# Patient Record
Sex: Male | Born: 1955 | Race: White | Hispanic: No | Marital: Married | Smoking: Never smoker
Health system: Southern US, Community
[De-identification: ages and names within clinical notes are randomized; demographics above are authoritative.]

## PROBLEM LIST (undated history)

## (undated) DIAGNOSIS — I4892 Unspecified atrial flutter: Secondary | ICD-10-CM

## (undated) DIAGNOSIS — K219 Gastro-esophageal reflux disease without esophagitis: Secondary | ICD-10-CM

## (undated) DIAGNOSIS — I10 Essential (primary) hypertension: Secondary | ICD-10-CM

## (undated) DIAGNOSIS — J309 Allergic rhinitis, unspecified: Secondary | ICD-10-CM

## (undated) HISTORY — DX: Gastro-esophageal reflux disease without esophagitis: K21.9

## (undated) HISTORY — DX: Essential (primary) hypertension: I10

## (undated) HISTORY — DX: Allergic rhinitis, unspecified: J30.9

---

## 2006-10-24 ENCOUNTER — Other Ambulatory Visit: Payer: Self-pay

## 2006-10-24 ENCOUNTER — Ambulatory Visit: Payer: Self-pay | Admitting: General Surgery

## 2006-10-29 ENCOUNTER — Ambulatory Visit: Payer: Self-pay | Admitting: General Surgery

## 2014-05-06 DIAGNOSIS — I10 Essential (primary) hypertension: Secondary | ICD-10-CM | POA: Insufficient documentation

## 2014-05-06 DIAGNOSIS — K219 Gastro-esophageal reflux disease without esophagitis: Secondary | ICD-10-CM | POA: Insufficient documentation

## 2014-05-06 DIAGNOSIS — J301 Allergic rhinitis due to pollen: Secondary | ICD-10-CM | POA: Insufficient documentation

## 2014-05-06 DIAGNOSIS — E781 Pure hyperglyceridemia: Secondary | ICD-10-CM | POA: Insufficient documentation

## 2017-04-25 DIAGNOSIS — Z23 Encounter for immunization: Secondary | ICD-10-CM | POA: Diagnosis not present

## 2018-05-19 DIAGNOSIS — J019 Acute sinusitis, unspecified: Secondary | ICD-10-CM | POA: Diagnosis not present

## 2018-05-19 DIAGNOSIS — H66001 Acute suppurative otitis media without spontaneous rupture of ear drum, right ear: Secondary | ICD-10-CM | POA: Diagnosis not present

## 2018-05-19 DIAGNOSIS — B9689 Other specified bacterial agents as the cause of diseases classified elsewhere: Secondary | ICD-10-CM | POA: Diagnosis not present

## 2018-06-06 DIAGNOSIS — H40039 Anatomical narrow angle, unspecified eye: Secondary | ICD-10-CM | POA: Diagnosis not present

## 2018-12-19 ENCOUNTER — Ambulatory Visit: Payer: Self-pay | Admitting: Internal Medicine

## 2018-12-25 DIAGNOSIS — E781 Pure hyperglyceridemia: Secondary | ICD-10-CM | POA: Diagnosis not present

## 2018-12-25 DIAGNOSIS — Z23 Encounter for immunization: Secondary | ICD-10-CM | POA: Diagnosis not present

## 2018-12-25 DIAGNOSIS — K219 Gastro-esophageal reflux disease without esophagitis: Secondary | ICD-10-CM | POA: Diagnosis not present

## 2018-12-25 DIAGNOSIS — I10 Essential (primary) hypertension: Secondary | ICD-10-CM | POA: Diagnosis not present

## 2018-12-25 DIAGNOSIS — Z0000 Encounter for general adult medical examination without abnormal findings: Secondary | ICD-10-CM | POA: Diagnosis not present

## 2019-01-12 DIAGNOSIS — Z0000 Encounter for general adult medical examination without abnormal findings: Secondary | ICD-10-CM | POA: Diagnosis not present

## 2019-01-12 DIAGNOSIS — Z125 Encounter for screening for malignant neoplasm of prostate: Secondary | ICD-10-CM | POA: Diagnosis not present

## 2019-02-10 ENCOUNTER — Ambulatory Visit: Payer: Self-pay | Admitting: Urology

## 2019-02-17 ENCOUNTER — Ambulatory Visit: Payer: No Typology Code available for payment source | Admitting: Urology

## 2019-02-17 ENCOUNTER — Encounter: Payer: Self-pay | Admitting: Urology

## 2019-02-17 ENCOUNTER — Other Ambulatory Visit: Payer: Self-pay

## 2019-02-17 VITALS — BP 193/112 | HR 85 | Ht 68.5 in | Wt 192.6 lb

## 2019-02-17 DIAGNOSIS — N486 Induration penis plastica: Secondary | ICD-10-CM

## 2019-02-17 NOTE — Progress Notes (Signed)
02/17/2019 9:07 AM   L[place]ayette Dragon May 11, 1956 [id number]  Referring provider: Sofie Hartigan, MD [address],  [place] [address]  Chief Complaint  Patient presents with  . Other    HPI: 63 year old male seen in consultation at the request of Dr. 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  Approximately 1 year ago he noted leftward curvature with erections at midshaft.  He estimates the curvature at 45 degrees and no significant change in the past 6 months.  He denies penile pain with erections.  He does note a slight hourglass deformity.  He has no significant erectile dysfunction and the curvature does not prohibit intercourse.  No history of penile fracture or Dupuytren's contracture.    Past urologic history remarkable for hydrocelectomy.  He denies bothersome lower urinary tract symptoms.  A PSA performed June 2020 was 1.88.   PMH: Past Medical History:  Diagnosis Date  . Allergic rhinitis   . GERD (gastroesophageal reflux disease)   . HTN (hypertension)     Surgical History: . DEVIATED SEPTUM SURGERY  . EXCISION HYDROCELE UNILATERAL Right  . HERNIA REPAIR 1/91/4782  umbilical hernia repair  . TONSILLECTOMY AND ADENOIDECTOMY   Home Medications:  Allergies as of 02/17/2019      Reactions   Codeine    Other reaction(s): Unknown      Medication List       Accurate as of February 17, 2019  9:07 AM. If you have any questions, ask your nurse or doctor.        famotidine 20 MG/2ML Soln   Ibuprofen 200 MG Caps   Olopatadine HCl 0.6 % Soln Place into the nose.   tiZANidine 4 MG tablet Commonly known as: ZANAFLEX Take 4 mg by mouth every 8 (eight) hours as needed.   triamcinolone 55 MCG/ACT Aero nasal inhaler Commonly known as: NASACORT Place into the nose.   Tylenol 325 MG Caps Generic drug: Acetaminophen   vitamin B-12 1000 MCG tablet Commonly known as: CYANOCOBALAMIN   Vitamin D 50 MCG (2000 UT) Caps       Allergies:  Allergies   Allergen Reactions  . Codeine     Other reaction(s): Unknown    Family History: No family history on file.  Social History:  reports that he has never smoked. He has never used smokeless tobacco. He reports current alcohol use. He reports that he does not use drugs.  ROS: UROLOGY Frequent Urination?: No Hard to postpone urination?: No Burning/pain with urination?: No Get up at night to urinate?: No Leakage of urine?: No Urine stream starts and stops?: No Trouble starting stream?: No Do you have to strain to urinate?: No Blood in urine?: No Urinary tract infection?: No Sexually transmitted disease?: No Injury to kidneys or bladder?: No Painful intercourse?: No Weak stream?: No Erection problems?: Yes Penile pain?: No  Gastrointestinal Nausea?: No Vomiting?: No Indigestion/heartburn?: No Diarrhea?: No Constipation?: No  Constitutional Fever: No Night sweats?: No Weight loss?: No Fatigue?: No  Skin Skin rash/lesions?: No Itching?: No  Eyes Blurred vision?: No Double vision?: No  Ears/Nose/Throat Sore throat?: No Sinus problems?: Yes  Hematologic/Lymphatic Swollen glands?: No Easy bruising?: No  Cardiovascular Leg swelling?: No Chest pain?: No  Respiratory Cough?: No Shortness of breath?: No  Endocrine Excessive thirst?: No  Musculoskeletal Back pain?: No Joint pain?: No  Neurological Headaches?: No Dizziness?: No  Psychologic Depression?: No Anxiety?: No  Physical Exam: BP (!) 193/112 (BP Location: Left Arm, Patient Position: Sitting, Cuff  Size: Normal)   Pulse 85   Ht 5' 8.5" (1.74 m)   Wt 192 lb 9.6 oz (87.4 kg)   BMI 28.86 kg/m   Constitutional:  Alert and oriented, No acute distress. HEENT: [place] AT, moist mucus membranes.  Trachea midline, no masses. Cardiovascular: No clubbing, cyanosis, or edema. Respiratory: Normal respiratory effort, no increased work of breathing. GI: Abdomen is soft, nontender, nondistended, no  abdominal masses GU: No CVA tenderness.  Penis circumcised.  Left lateral corpus distal shaft plaque palpated approximately 1 cm Skin: No rashes, bruises or suspicious lesions. Neurologic: Grossly intact, no focal deficits, moving all 4 extremities. Psychiatric: Normal mood and affect.   Assessment & Plan:   Based on patient's history of physical exam, findings are consistent with Peyronie's disease.  Pathophysiology was discussed at length including the 2 phases of the diease, acute and chronic.  Treatment options and goals of treatment were discussed today in detail. Options including observation, penile plaque and graft, penile plication, placement of penile prosthesis, and injection of collagenase were all reviewed.  An benefits of each were discussed at length.  We also discussed off label use of medications such as pentoxifylline and the use penile traction devices.  He would like to think over these options and will call back with his decision or any further questions.  If he elects observation will set up a 3866-month follow-up.   Riki AltesScott C Laurann Mcmorris, MD  [facility] [address], [address] [place], [place] [address] [phone]

## 2019-02-23 ENCOUNTER — Encounter: Payer: Self-pay | Admitting: Urology

## 2019-02-26 ENCOUNTER — Other Ambulatory Visit: Payer: Self-pay | Admitting: Urology

## 2019-02-26 MED ORDER — TADALAFIL 20 MG PO TABS: ORAL_TABLET | ORAL | 0 refills | Status: DC

## 2019-08-17 ENCOUNTER — Ambulatory Visit: Payer: No Typology Code available for payment source | Admitting: Urology

## 2019-09-02 ENCOUNTER — Ambulatory Visit (INDEPENDENT_AMBULATORY_CARE_PROVIDER_SITE_OTHER): Payer: No Typology Code available for payment source | Admitting: Urology

## 2019-09-02 ENCOUNTER — Other Ambulatory Visit: Payer: Self-pay

## 2019-09-02 ENCOUNTER — Encounter: Payer: Self-pay | Admitting: Urology

## 2019-09-02 VITALS — BP 194/84 | HR 62 | Ht 68.0 in | Wt 180.0 lb

## 2019-09-02 DIAGNOSIS — N486 Induration penis plastica: Secondary | ICD-10-CM | POA: Diagnosis not present

## 2019-09-02 DIAGNOSIS — N521 Erectile dysfunction due to diseases classified elsewhere: Secondary | ICD-10-CM

## 2019-09-02 MED ORDER — TADALAFIL 20 MG PO TABS: ORAL_TABLET | ORAL | 6 refills | Status: DC

## 2019-09-02 NOTE — Progress Notes (Signed)
   09/02/2019 9:51 AM   Gordon Garcia 01/19/1956 [id number]  Referring provider: Marina Goodell, MD [address] [place],  [place] [address]  Chief Complaint  Patient presents with  . Follow-up    HPI: 64 y.o. male presents for a follow-up of Peyronie's disease.  He was seen August 2020 with complaints of a 45 degree curvature.  He had mild to moderate ED and was started on tadalafil.  Management options for Peyronie's.  He states the tadalafil has been effective.  His curvature has been stable.  No pain with erections.   PMH: Past Medical History:  Diagnosis Date  . Allergic rhinitis   . GERD (gastroesophageal reflux disease)   . HTN (hypertension)     Surgical History: No past surgical history on file.  Home Medications:  Allergies as of 09/02/2019      Reactions   Codeine    Other reaction(s): Unknown      Medication List       Accurate as of September 02, 2019  9:51 AM. If you have any questions, ask your nurse or doctor.        famotidine 20 MG/2ML Soln   Ibuprofen 200 MG Caps   Olopatadine HCl 0.6 % Soln Place into the nose.   tadalafil 20 MG tablet Commonly known as: CIALIS 1 tablet by mouth 1 hour prior to intercourse   tiZANidine 4 MG tablet Commonly known as: ZANAFLEX Take 4 mg by mouth every 8 (eight) hours as needed.   triamcinolone 55 MCG/ACT Aero nasal inhaler Commonly known as: NASACORT Place into the nose.   Tylenol 325 MG Caps Generic drug: Acetaminophen   vitamin B-12 1000 MCG tablet Commonly known as: CYANOCOBALAMIN   Vitamin D 50 MCG (2000 UT) Caps       Allergies:  Allergies  Allergen Reactions  . Codeine     Other reaction(s): Unknown    Family History: No family history on file.  Social History:  reports that he has never smoked. He has never used smokeless tobacco. He reports current alcohol use. He reports that he does not use drugs.   Physical Exam: BP (!) 194/84   Pulse 62   Ht 5\' 8"  (1.727 m)   Wt 180  lb (81.6 kg)   BMI 27.37 kg/m   Constitutional:  Alert and oriented, No acute distress. HEENT: [place] AT, moist mucus membranes.  Trachea midline, no masses. Cardiovascular: No clubbing, cyanosis, or edema. Respiratory: Normal respiratory effort, no increased work of breathing. Neurologic: Grossly intact, no focal deficits, moving all 4 extremities. Psychiatric: Normal mood and affect.   Assessment & Plan:    - Peyronie's disease Stable curvature and able to have successful intercourse with tadalafil.  He does not desire any treatment.  - Erectile dysfunction Tadalafil was refilled.  Can see him back as needed if Dr. will refill tadalafil annually.   Maryjane Hurter, MD  [facility] [address] [place], [place] [id number]

## 2019-09-04 ENCOUNTER — Encounter: Payer: Self-pay | Admitting: Urology

## 2019-09-04 DIAGNOSIS — N521 Erectile dysfunction due to diseases classified elsewhere: Secondary | ICD-10-CM | POA: Insufficient documentation

## 2020-09-19 ENCOUNTER — Encounter: Payer: Self-pay | Admitting: *Deleted

## 2021-01-09 ENCOUNTER — Other Ambulatory Visit: Payer: Self-pay

## 2021-01-09 DIAGNOSIS — Z86010 Personal history of colonic polyps: Secondary | ICD-10-CM

## 2021-01-09 MED ORDER — NA SULFATE-K SULFATE-MG SULF 17.5-3.13-1.6 GM/177ML PO SOLN: 354.0000 mL | Freq: Once | ORAL | 0 refills | Status: AC

## 2021-01-09 NOTE — Progress Notes (Unsigned)
Gastroenterology Pre-Procedure Review  Request Date: 02/14/2021 Requesting Physician: Dr. Servando Snare  PATIENT REVIEW QUESTIONS: The patient responded to the following health history questions as indicated:    1. Are you having any GI issues? no 2. Do you have a personal history of Polyps? Yes  3. Do you have a family history of Colon Cancer or Polyps?  No  4. Diabetes Mellitus? no 5. Joint replacements in the past 12 months?no 6. Major health problems in the past 3 months?no 7. Any artificial heart valves, MVP, or defibrillator?no    MEDICATIONS & ALLERGIES:    Patient reports the following regarding taking any anticoagulation/antiplatelet therapy:   Plavix, Coumadin, Eliquis, Xarelto, Lovenox, Pradaxa, Brilinta, or Effient? no Aspirin? no  Patient confirms/reports the following medications:  Current Outpatient Medications  Medication Sig Dispense Refill   Na Sulfate-K Sulfate-Mg Sulf 17.5-3.13-1.6 GM/177ML SOLN Take 354 mLs by mouth once for 1 dose. 354 mL 0   Acetaminophen (TYLENOL) 325 MG CAPS      Cholecalciferol (VITAMIN D) 50 MCG (2000 UT) CAPS      famotidine 20 MG/2ML SOLN      Ibuprofen 200 MG CAPS      Olopatadine HCl 0.6 % SOLN Place into the nose.     tadalafil (CIALIS) 20 MG tablet 1 tablet by mouth 1 hour prior to intercourse 30 tablet 6   tiZANidine (ZANAFLEX) 4 MG tablet Take 4 mg by mouth every 8 (eight) hours as needed.     triamcinolone (NASACORT) 55 MCG/ACT AERO nasal inhaler Place into the nose.     vitamin B-12 (CYANOCOBALAMIN) 1000 MCG tablet      No current facility-administered medications for this visit.    Patient confirms/reports the following allergies:  Allergies  Allergen Reactions   Codeine     Other reaction(s): Unknown    No orders of the defined types were placed in this encounter.   AUTHORIZATION INFORMATION Primary Insurance: 1D#: Group #:  Secondary Insurance: 1D#: Group #:  SCHEDULE INFORMATION: Date:  Time: Location:

## 2021-02-14 ENCOUNTER — Ambulatory Visit
Admission: RE | Admit: 2021-02-14 | Discharge: 2021-02-14 | Disposition: A | Discharge status: Home / Self Care | Payer: 59 | Attending: Gastroenterology | Admitting: Gastroenterology

## 2021-02-14 ENCOUNTER — Encounter: Payer: Self-pay | Admitting: Gastroenterology

## 2021-02-14 ENCOUNTER — Ambulatory Visit: Payer: 59 | Admitting: Certified Registered"

## 2021-02-14 ENCOUNTER — Other Ambulatory Visit: Payer: Self-pay

## 2021-02-14 ENCOUNTER — Encounter
Admission: RE | Disposition: A | Discharge status: Home / Self Care | Payer: Self-pay | Source: Home / Self Care | Attending: Gastroenterology

## 2021-02-14 DIAGNOSIS — Z79899 Other long term (current) drug therapy: Secondary | ICD-10-CM | POA: Insufficient documentation

## 2021-02-14 DIAGNOSIS — Z791 Long term (current) use of non-steroidal anti-inflammatories (NSAID): Secondary | ICD-10-CM | POA: Insufficient documentation

## 2021-02-14 DIAGNOSIS — Z885 Allergy status to narcotic agent status: Secondary | ICD-10-CM | POA: Diagnosis not present

## 2021-02-14 DIAGNOSIS — K641 Second degree hemorrhoids: Secondary | ICD-10-CM | POA: Diagnosis not present

## 2021-02-14 DIAGNOSIS — Z860100 Personal history of colon polyps, unspecified: Secondary | ICD-10-CM

## 2021-02-14 DIAGNOSIS — K5730 Diverticulosis of large intestine without perforation or abscess without bleeding: Secondary | ICD-10-CM | POA: Insufficient documentation

## 2021-02-14 DIAGNOSIS — Z1211 Encounter for screening for malignant neoplasm of colon: Secondary | ICD-10-CM

## 2021-02-14 HISTORY — PX: COLONOSCOPY WITH PROPOFOL: SHX5780

## 2021-02-14 SURGERY — COLONOSCOPY WITH PROPOFOL
Anesthesia: General

## 2021-02-14 MED ORDER — EPHEDRINE 5 MG/ML INJ
INTRAVENOUS | Status: AC
  Filled 2021-02-14: qty 5

## 2021-02-14 MED ORDER — PROPOFOL 10 MG/ML IV BOLUS
INTRAVENOUS | Status: DC | PRN
  Administered 2021-02-14: 70 mg via INTRAVENOUS
  Administered 2021-02-14: 30 mg via INTRAVENOUS

## 2021-02-14 MED ORDER — LIDOCAINE 2% (20 MG/ML) 5 ML SYRINGE
INTRAMUSCULAR | Status: DC | PRN
  Administered 2021-02-14: 25 mg via INTRAVENOUS

## 2021-02-14 MED ORDER — GLYCOPYRROLATE 0.2 MG/ML IJ SOLN
INTRAMUSCULAR | Status: AC
  Filled 2021-02-14: qty 1

## 2021-02-14 MED ORDER — PROPOFOL 500 MG/50ML IV EMUL
INTRAVENOUS | Status: DC | PRN
  Administered 2021-02-14: 120 ug/kg/min via INTRAVENOUS

## 2021-02-14 MED ORDER — SODIUM CHLORIDE 0.9 % IV SOLN: INTRAVENOUS | Status: DC

## 2021-02-14 MED ORDER — LIDOCAINE HCL (PF) 2 % IJ SOLN
INTRAMUSCULAR | Status: AC
  Filled 2021-02-14: qty 5

## 2021-02-14 MED ORDER — MIDAZOLAM HCL 2 MG/2ML IJ SOLN
INTRAMUSCULAR | Status: AC
  Filled 2021-02-14: qty 2

## 2021-02-14 MED ORDER — MIDAZOLAM HCL 5 MG/5ML IJ SOLN
INTRAMUSCULAR | Status: DC | PRN
  Administered 2021-02-14: 2 mg via INTRAVENOUS

## 2021-02-14 NOTE — Op Note (Signed)
[facility] Gastroenterology Patient Name: Gordon Garcia Procedure Date: 02/14/2021 9:59 AM MRN: [id number] Account #: [id number] Date of Birth: 05/23/1956 Admit Type: Outpatient Age: 65 Room: [facility] ENDO ROOM 4 Gender: Male Note Status: Finalized Procedure:             Colonoscopy Indications:           High risk colon cancer surveillance: Personal history                         of colonic polyps Providers:             Midge Minium MD, MD Referring MD:          Marina Goodell (Referring MD) Medicines:             Propofol per Anesthesia Complications:         No immediate complications. Procedure:             Pre-Anesthesia Assessment:                        - Prior to the procedure, a History and Physical was                         performed, and patient medications and allergies were                         reviewed. The patient's tolerance of previous                         anesthesia was also reviewed. The risks and benefits                         of the procedure and the sedation options and risks                         were discussed with the patient. All questions were                         answered, and informed consent was obtained. Prior                         Anticoagulants: The patient has taken no previous                         anticoagulant or antiplatelet agents. ASA Grade                         Assessment: II - A patient with mild systemic disease.                         After reviewing the risks and benefits, the patient                         was deemed in satisfactory condition to undergo the                         procedure.  After obtaining informed consent, the colonoscope was                         passed under direct vision. Throughout the procedure,                         the patient's blood pressure, pulse, and oxygen                         saturations were monitored continuously. The                          Colonoscope was introduced through the anus and                         advanced to the the cecum, identified by appendiceal                         orifice and ileocecal valve. The colonoscopy was                         performed without difficulty. The patient tolerated                         the procedure well. The quality of the bowel                         preparation was excellent. Findings:      The perianal and digital rectal examinations were normal.      A few small-mouthed diverticula were found in the sigmoid colon.      Non-bleeding internal hemorrhoids were found during retroflexion. The       hemorrhoids were Grade II (internal hemorrhoids that prolapse but reduce       spontaneously). Impression:            - Diverticulosis in the sigmoid colon.                        - Non-bleeding internal hemorrhoids.                        - No specimens collected. Recommendation:        - Discharge patient to home.                        - Resume previous diet.                        - Continue present medications.                        - Repeat colonoscopy in 7 years for surveillance. Procedure Code(s):     --- Professional ---                        [phone], Colonoscopy, flexible; diagnostic, including                         collection of specimen(s) by brushing or washing, when  performed (separate procedure) Diagnosis Code(s):     --- Professional ---                        Z86.010, Personal history of colonic polyps CPT copyright 2019 American Medical Association. All rights reserved. The codes documented in this report are preliminary and upon coder review may  be revised to meet current compliance requirements. Midge Minium MD, MD 02/14/2021 10:26:49 AM This report has been signed electronically. Number of Addenda: 0 Note Initiated On: 02/14/2021 9:59 AM Scope Withdrawal Time: 0 hours 6 minutes 54 seconds  Total Procedure Duration: 0 hours 10  minutes 36 seconds  Estimated Blood Loss:  Estimated blood loss: none.      [facility]

## 2021-02-14 NOTE — H&P (Signed)
Gordon Minium, MD [facility] [address], [address] [place], [place] [address] Phone:[phone] Fax : [phone]  Primary Care Physician:  Marina Goodell, MD Primary Gastroenterologist:  Dr. Servando Snare  Pre-Procedure History & Physical: HPI:  Gordon Garcia is a 65 y.o. male is here for an colonoscopy.   Past Medical History:  Diagnosis Date   Allergic rhinitis    GERD (gastroesophageal reflux disease)    HTN (hypertension)     History reviewed. No pertinent surgical history.  Prior to Admission medications   Medication Sig Start Date End Date Taking? Authorizing Provider  Acetaminophen (TYLENOL) 325 MG CAPS    Yes [provider]  Cholecalciferol (VITAMIN D) 50 MCG (2000 UT) CAPS    Yes [provider]  famotidine 20 MG/2ML SOLN    Yes [provider]  Ibuprofen 200 MG CAPS    Yes [provider]  tadalafil (CIALIS) 20 MG tablet 1 tablet by mouth 1 hour prior to intercourse 09/02/19  Yes Stoioff, Verna Czech, MD  tiZANidine (ZANAFLEX) 4 MG tablet Take 4 mg by mouth every 8 (eight) hours as needed. 12/25/18  Yes [provider]  triamcinolone (NASACORT) 55 MCG/ACT AERO nasal inhaler Place into the nose.   Yes [provider]  vitamin B-12 (CYANOCOBALAMIN) 1000 MCG tablet    Yes [provider]  Olopatadine HCl 0.6 % SOLN Place into the nose. 04/22/18 04/22/19  [provider]    Allergies as of 01/10/2021 - Review Complete 09/04/2019  Allergen Reaction Noted   Codeine  05/05/2014    History reviewed. No pertinent family history.  Social History   Socioeconomic History   Marital status: Married    Spouse name: Not on file   Number of children: Not on file   Years of education: Not on file   Highest education level: Not on file  Occupational History   Not on file  Tobacco Use   Smoking status: Never   Smokeless tobacco: Never  Vaping Use   Vaping Use: Some days  Substance and Sexual Activity   Alcohol use:  Yes    Comment: 2-3 drinks per week   Drug use: Never   Sexual activity: Yes    Birth control/protection: None  Other Topics Concern   Not on file  Social History Narrative   Not on file   Social Determinants of Health   Financial Resource Strain: Not on file  Food Insecurity: Not on file  Transportation Needs: Not on file  Physical Activity: Not on file  Stress: Not on file  Social Connections: Not on file  Intimate Partner Violence: Not on file    Review of Systems: See HPI, otherwise negative ROS  Physical Exam: BP (!) 156/94   Pulse (!) 59   Temp 97.7 F (36.5 C) (Temporal)   Resp 16   Ht 5\' 9"  (1.753 m)   Wt 76.2 kg   SpO2 100%   BMI 24.81 kg/m  General:   Alert,  pleasant and cooperative in NAD Head:  Normocephalic and atraumatic. Neck:  Supple; no masses or thyromegaly. Lungs:  Clear throughout to auscultation.    Heart:  Regular rate and rhythm. Abdomen:  Soft, nontender and nondistended. Normal bowel sounds, without guarding, and without rebound.   Neurologic:  Alert and  oriented x4;  grossly normal neurologically.  Impression/Plan: Gordon Garcia is here for an colonoscopy to be performed for a history of adenomatous polyps on 2016  Risks, benefits, limitations, and alternatives  regarding  colonoscopy have been reviewed with the patient.  Questions have been answered.  All parties agreeable.   Gordon Minium, MD  02/14/2021, 10:08 AM

## 2021-02-14 NOTE — Anesthesia Preprocedure Evaluation (Signed)
Anesthesia Evaluation  Patient identified by MRN, date of birth, ID band Patient awake    Reviewed: Allergy & Precautions, H&P , NPO status , Patient's Chart, lab work & pertinent test results, reviewed documented beta blocker date and time   History of Anesthesia Complications Negative for: history of anesthetic complications  Airway Mallampati: I  TM Distance: >3 FB Neck ROM: full    Dental  (+) Dental Advidsory Given, Caps, Implants, Teeth Intact, Missing   Pulmonary neg pulmonary ROS,    Pulmonary exam normal breath sounds clear to auscultation       Cardiovascular Exercise Tolerance: Good hypertension, (-) angina(-) Past MI and (-) Cardiac Stents Normal cardiovascular exam(-) dysrhythmias (-) Valvular Problems/Murmurs Rhythm:regular Rate:Normal     Neuro/Psych negative neurological ROS  negative psych ROS   GI/Hepatic Neg liver ROS, GERD  ,  Endo/Other  negative endocrine ROS  Renal/GU negative Renal ROS  negative genitourinary   Musculoskeletal   Abdominal   Peds  Hematology negative hematology ROS (+)   Anesthesia Other Findings Past Medical History: No date: Allergic rhinitis No date: GERD (gastroesophageal reflux disease) No date: HTN (hypertension)   Reproductive/Obstetrics negative OB ROS                             Anesthesia Physical Anesthesia Plan  ASA: 2  Anesthesia Plan: General   Post-op Pain Management:    Induction: Intravenous  PONV Risk Score and Plan: 2 and TIVA and Propofol infusion  Airway Management Planned: Natural Airway and Nasal Cannula  Additional Equipment:   Intra-op Plan:   Post-operative Plan:   Informed Consent: I have reviewed the patients History and Physical, chart, labs and discussed the procedure including the risks, benefits and alternatives for the proposed anesthesia with the patient or authorized representative who has  indicated his/her understanding and acceptance.     Dental Advisory Given  Plan Discussed with: Anesthesiologist, CRNA and Surgeon  Anesthesia Plan Comments:         Anesthesia Quick Evaluation

## 2021-02-14 NOTE — Transfer of Care (Signed)
Immediate Anesthesia Transfer of Care Note  Patient: Gordon Garcia  Procedure(s) Performed: COLONOSCOPY WITH PROPOFOL  Patient Location: Endoscopy Unit  Anesthesia Type:General  Level of Consciousness: drowsy  Airway & Oxygen Therapy: Patient Spontanous Breathing  Post-op Assessment: Report given to RN and Post -op Vital signs reviewed and stable  Post vital signs: Reviewed  Last Vitals:  Vitals Value Taken Time  BP    Temp    Pulse    Resp    SpO2      Last Pain:  Vitals:   02/14/21 0926  TempSrc: Temporal  PainSc: 0-No pain         Complications: No notable events documented.

## 2021-02-15 ENCOUNTER — Encounter: Payer: Self-pay | Admitting: Gastroenterology

## 2021-02-15 NOTE — Anesthesia Postprocedure Evaluation (Signed)
Anesthesia Post Note  Patient: Gordon Garcia  Procedure(s) Performed: COLONOSCOPY WITH PROPOFOL  Patient location during evaluation: Endoscopy Anesthesia Type: General Level of consciousness: awake and alert Pain management: pain level controlled Vital Signs Assessment: post-procedure vital signs reviewed and stable Respiratory status: spontaneous breathing, nonlabored ventilation, respiratory function stable and patient connected to nasal cannula oxygen Cardiovascular status: blood pressure returned to baseline and stable Postop Assessment: no apparent nausea or vomiting Anesthetic complications: no   No notable events documented.   Last Vitals:  Vitals:   02/14/21 1049 02/14/21 1059  BP: (!) 132/92 (!) 151/98  Pulse: (!) 49 (!) 47  Resp: 20 18  Temp: 37.2 C   SpO2: 100% 100%    Last Pain:  Vitals:   02/14/21 1059  TempSrc:   PainSc: 0-No pain                 Lenard Simmer

## 2021-11-16 ENCOUNTER — Emergency Department: Payer: 59

## 2021-11-16 ENCOUNTER — Other Ambulatory Visit: Payer: Self-pay

## 2021-11-16 ENCOUNTER — Emergency Department
Admission: EM | Admit: 2021-11-16 | Discharge: 2021-11-16 | Disposition: A | Discharge status: Home / Self Care | Payer: 59 | Attending: Emergency Medicine | Admitting: Emergency Medicine

## 2021-11-16 DIAGNOSIS — I10 Essential (primary) hypertension: Secondary | ICD-10-CM | POA: Diagnosis not present

## 2021-11-16 DIAGNOSIS — I483 Typical atrial flutter: Secondary | ICD-10-CM | POA: Diagnosis not present

## 2021-11-16 DIAGNOSIS — R002 Palpitations: Secondary | ICD-10-CM | POA: Diagnosis present

## 2021-11-16 LAB — CBC
HCT: 46.6 % (ref 39.0–52.0)
Hemoglobin: 16.4 g/dL (ref 13.0–17.0)
MCH: 31.8 pg (ref 26.0–34.0)
MCHC: 35.2 g/dL (ref 30.0–36.0)
MCV: 90.3 fL (ref 80.0–100.0)
Platelets: 296 10*3/uL (ref 150–400)
RBC: 5.16 MIL/uL (ref 4.22–5.81)
RDW: 12.5 % (ref 11.5–15.5)
WBC: 7.3 10*3/uL (ref 4.0–10.5)
nRBC: 0 % (ref 0.0–0.2)

## 2021-11-16 LAB — COMPREHENSIVE METABOLIC PANEL
ALT: 24 U/L (ref 0–44)
AST: 36 U/L (ref 15–41)
Albumin: 4.4 g/dL (ref 3.5–5.0)
Alkaline Phosphatase: 55 U/L (ref 38–126)
Anion gap: 10 (ref 5–15)
BUN: 26 mg/dL — ABNORMAL HIGH (ref 8–23)
CO2: 26 mmol/L (ref 22–32)
Calcium: 9.5 mg/dL (ref 8.9–10.3)
Chloride: 104 mmol/L (ref 98–111)
Creatinine, Ser: 1.22 mg/dL (ref 0.61–1.24)
GFR, Estimated: >60 mL/min (ref >60)
Glucose, Bld: 98 mg/dL (ref 70–99)
Potassium: 3.8 mmol/L (ref 3.5–5.1)
Sodium: 140 mmol/L (ref 135–145)
Total Bilirubin: 1.3 mg/dL — ABNORMAL HIGH (ref 0.3–1.2)
Total Protein: 7.5 g/dL (ref 6.5–8.1)

## 2021-11-16 LAB — TROPONIN I (HIGH SENSITIVITY): Troponin I (High Sensitivity): 10 ng/L (ref <18)

## 2021-11-16 NOTE — Discharge Instructions (Signed)
You were found to be in a rhythm called atrial flutter.  This does put you at higher risk for strokes and you will likely need to be put on a blood thinner.  Please follow-up with your cardiologist for further work-up.  If your symptoms recur, please return to the emergency department. ?

## 2021-11-16 NOTE — ED Provider Notes (Signed)
? ?[facility] ?Provider Note ? ? ? Event Date/Time  ? First MD Initiated Contact with Patient 11/16/21 0141   ?  (approximate) ? ? ?History  ? ?Palpitations ? ? ?HPI ? ?Gordon Garcia is a 66 y.o. male with past medical history of hypertension, GERD who presents with palpitations.  Symptoms started this evening awoke him from sleep.  His Apple Watch showed heart rate that was elevated so he came to the emergency department.  Denies chest pain shortness of breath nausea diaphoresis.  Does drink several times a week but no increase in drinking frequency or binge drinking.  Denies lower extremity edema or history of atrial fibrillation or a flutter. ?  ? ?Past Medical History:  ?Diagnosis Date  ? Allergic rhinitis   ? GERD (gastroesophageal reflux disease)   ? HTN (hypertension)   ? ? ?Patient Active Problem List  ? Diagnosis Date Noted  ? Hx of colonic polyps   ? Erectile dysfunction due to diseases classified elsewhere 09/04/2019  ? 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 Allergic rhinitis due to pollen 05/06/2014  ? Gastroesophageal reflux disease without esophagitis 05/06/2014  ? Hypertension 05/06/2014  ? Hypertriglyceridemia 05/06/2014  ? ? ? ?Physical Exam  ?Triage Vital Signs: ?ED Triage Vitals  ?Enc Vitals Group  ?   BP 11/16/21 0124 (!) 160/120  ?   Pulse Rate 11/16/21 0124 (!) 148  ?   Resp 11/16/21 0124 16  ?   Temp 11/16/21 0124 (!) 97.4 ?F (36.3 ?C)  ?   Temp Source 11/16/21 0124 Oral  ?   SpO2 11/16/21 0124 98 %  ?   Weight 11/16/21 0125 171 lb (77.6 kg)  ?   Height 11/16/21 0125 5\' 9"  (1.753 m)  ?   Head Circumference --   ?   Peak Flow --   ?   Pain Score 11/16/21 0125 0  ?   Pain Loc --   ?   Pain Edu? --   ?   Excl. in [place]? --   ? ? ?Most recent vital signs: ?Vitals:  ? 11/16/21 0230 11/16/21 0304  ?BP: (!) 131/91 122/89  ?Pulse: 63 71  ?Resp: 18 17  ?Temp:    ?SpO2: 95% 96%  ? ? ? ?General: Awake, no distress.  ?CV:  Good peripheral perfusion.  No lower extremity edema ?Resp:  Normal  effort.  ?Abd:  No distention.  ?Neuro:             Awake, Alert, Oriented x 3  ?Other:   ? ? ?ED Results / Procedures / Treatments  ?Labs ?(all labs ordered are listed, but only abnormal results are displayed) ?Labs Reviewed  ?COMPREHENSIVE METABOLIC PANEL - Abnormal; Notable for the following components:  ?    Result Value  ? BUN 26 (*)   ? Total Bilirubin 1.3 (*)   ? All other components within normal limits  ?CBC  ?TROPONIN I (HIGH SENSITIVITY)  ? ? ? ?EKG ? ?EKG shows atrial flutter with 2-1 block no ischemic changes ? ?Repeat EKG shows normal sinus rhythm normal axis normal intervals no acute ischemic changes ? ? ?RADIOLOGY ?I reviewed the CXR which does not show any acute cardiopulmonary process; agree with radiology report  ? ? ? ?PROCEDURES: ? ?Critical Care performed: No ? ?Procedures ? ?The patient is on the cardiac monitor to evaluate for evidence of arrhythmia and/or significant heart rate changes. ? ? ?MEDICATIONS ORDERED IN ED: ?Medications - No data to display ? ? ?  IMPRESSION / MDM / ASSESSMENT AND PLAN / ED COURSE  ?I reviewed the triage vital signs and the nursing notes. ?             ?               ? ?Differential diagnosis includes, but is not limited to, electrolyte normality, alcohol induced arrhythmia, cardiomyopathy, structural heart disease ? ?Patient is a 66 year old male with no prior history of A-fib or a flutter presents with palpitations.  Initial EKG showing a flutter with 2 1 conduction rates in the 150s.  Prior to my evaluation patient converted spontaneously to normal sinus rhythm and he is now in normal sinus rate 60s to 70s.  Was symptomatic just with palpitations at onset but had no chest pain shortness of breath.  Does drink several times a week but has no recent binge drinking.  No prior history of heart disease.  Patient currently asymptomatic.  His labs are all reassuring for a negative troponin.  Electrolytes are normal.  He does have a chadsvasc score of 2 for age and  history of hypertension I discussed with him that even though he is back in sinus rhythm but the intermittent a flutter we would recommend anticoagulation.  He preferred to discuss this with his family doctor and cardiologist.  Recommended that he follow-up with cardiology as he will need echo possible Holter monitoring.  I also recommended that if his tachycardia returns that he should come back to the ED if is not improving or if he develops any symptoms such as chest pain or dyspnea with it.  He will follow-up with his cardiologist.  Given he is back in sinus rhythm we will not start any rate control medication at this time. ? ?  ? ? ?FINAL CLINICAL IMPRESSION(S) / ED DIAGNOSES  ? ?Final diagnoses:  ?Typical atrial flutter ([place])  ? ? ? ?Rx / DC Orders  ? ?ED Discharge Orders   ? ? None  ? ?  ? ? ? ?Note:  This document was prepared using Dragon voice recognition software and may include unintentional dictation errors. ?  ?Rada Hay, MD ?11/16/21 [phone] ? ?

## 2021-11-16 NOTE — ED Notes (Signed)
Pt and s/o given education on heart rhythm that was experiences tonight, and given instructions on follow up with pcp/cardiologist.  Pt and spouse verbalize understanding and all questions answered.  Pt in NAD and HR and rhythm WNL upon DC.   ?

## 2021-11-16 NOTE — ED Triage Notes (Signed)
Pt presents to ER c/o palpitations/tachycardia that woke him from his sleep tonight.  Pt denies hx of abnormal heart rhythms.  Denies any cardiac hx whatsoever.  Pt denies chest pain, sob.  Just states that the feeling is uncomfortable.  Pt is A&O x4 at this time in NAD in triage.   ?

## 2023-05-12 IMAGING — DX DG CHEST 1V PORT
1 series · 1 of 1 positions shown · non-contrast
Comparison: None Available.

CLINICAL DATA: Heart palpitations.

EXAM:
PORTABLE CHEST 1 VIEW

[chest ap]
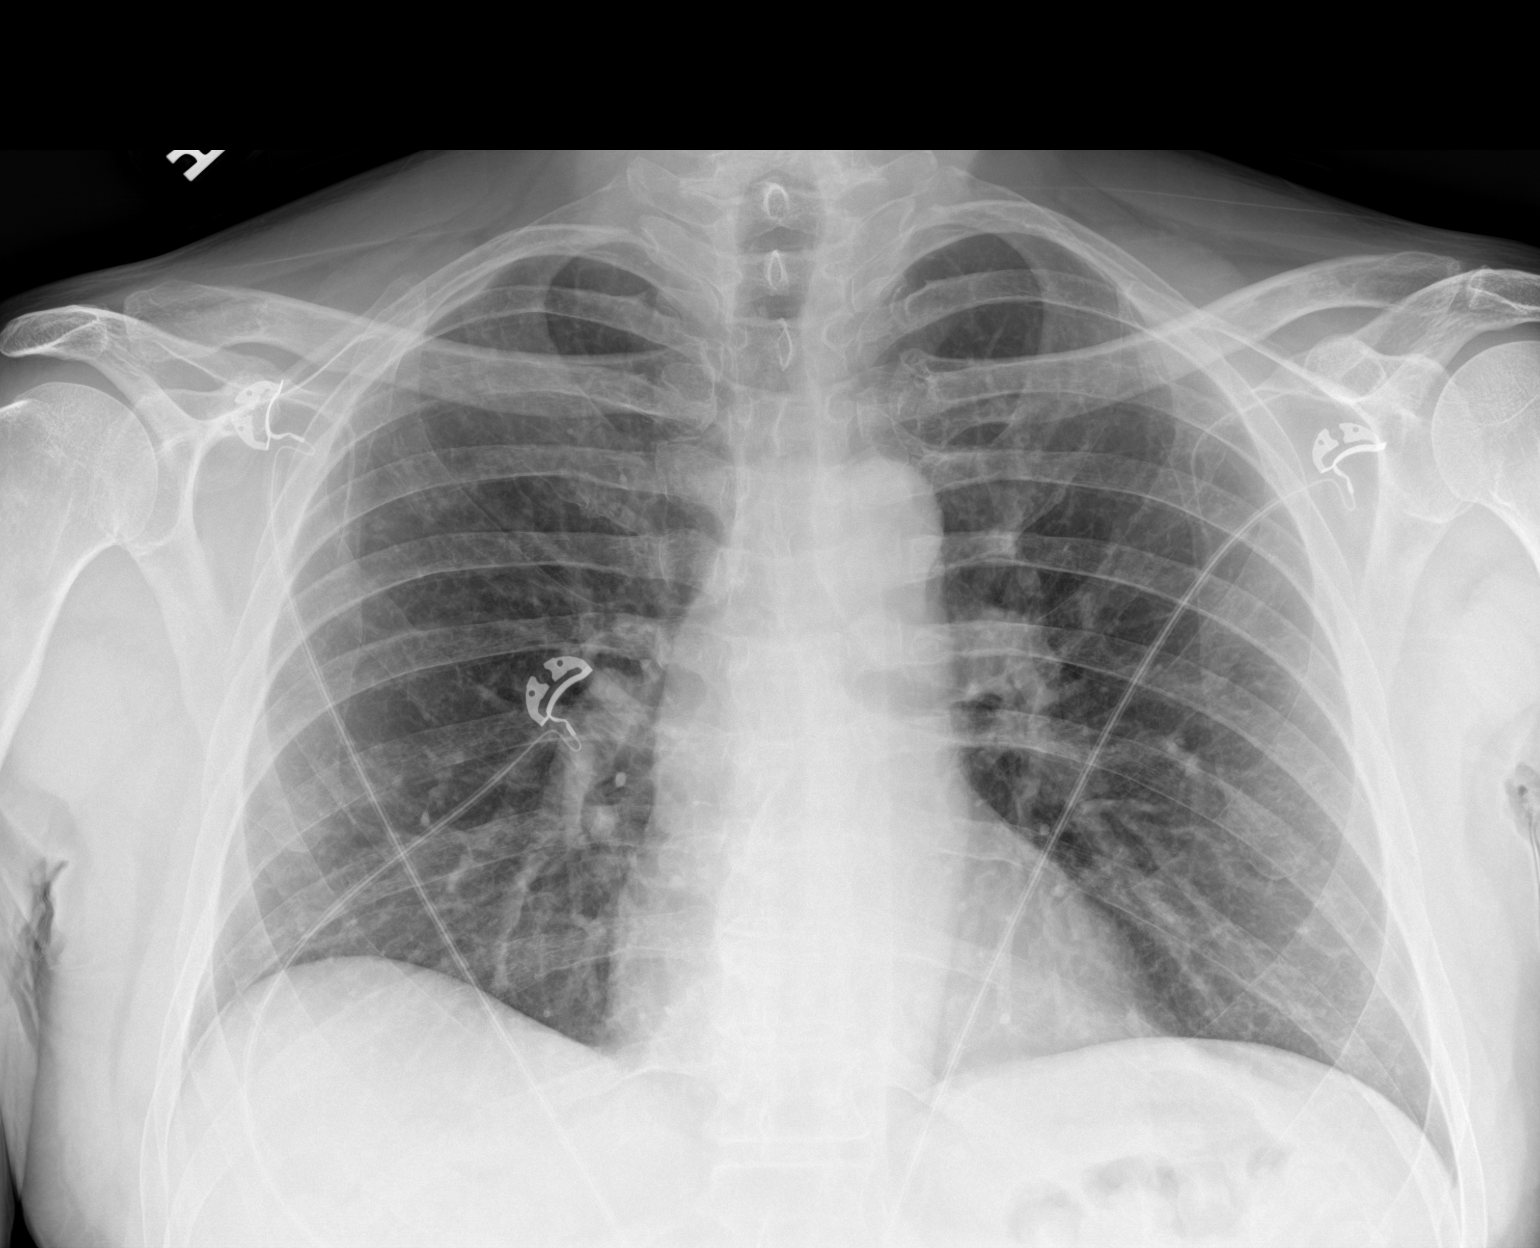

[1 of 1 positions shown; findings below may reference images not displayed]

FINDINGS: The heart size and mediastinal contours are within normal limits.
Both lungs are clear. The visualized skeletal structures are
unremarkable.
IMPRESSION: No evidence of acute chest disease.

## 2023-08-10 ENCOUNTER — Other Ambulatory Visit: Payer: Self-pay

## 2023-08-10 ENCOUNTER — Emergency Department: Payer: Managed Care, Other (non HMO)

## 2023-08-10 ENCOUNTER — Encounter: Payer: Self-pay | Admitting: Intensive Care

## 2023-08-10 ENCOUNTER — Observation Stay
Admission: EM | Admit: 2023-08-10 | Discharge: 2023-08-11 | Disposition: A | Discharge status: Home / Self Care | Payer: Managed Care, Other (non HMO) | Attending: Osteopathic Medicine | Admitting: Osteopathic Medicine

## 2023-08-10 DIAGNOSIS — F17290 Nicotine dependence, other tobacco product, uncomplicated: Secondary | ICD-10-CM | POA: Diagnosis not present

## 2023-08-10 DIAGNOSIS — I480 Paroxysmal atrial fibrillation: Secondary | ICD-10-CM | POA: Insufficient documentation

## 2023-08-10 DIAGNOSIS — G459 Transient cerebral ischemic attack, unspecified: Secondary | ICD-10-CM | POA: Diagnosis not present

## 2023-08-10 DIAGNOSIS — R29898 Other symptoms and signs involving the musculoskeletal system: Secondary | ICD-10-CM

## 2023-08-10 DIAGNOSIS — Z79899 Other long term (current) drug therapy: Secondary | ICD-10-CM | POA: Diagnosis not present

## 2023-08-10 DIAGNOSIS — R29818 Other symptoms and signs involving the nervous system: Secondary | ICD-10-CM | POA: Diagnosis present

## 2023-08-10 DIAGNOSIS — I160 Hypertensive urgency: Secondary | ICD-10-CM

## 2023-08-10 DIAGNOSIS — I10 Essential (primary) hypertension: Principal | ICD-10-CM | POA: Insufficient documentation

## 2023-08-10 DIAGNOSIS — I161 Hypertensive emergency: Secondary | ICD-10-CM | POA: Insufficient documentation

## 2023-08-10 DIAGNOSIS — R519 Headache, unspecified: Secondary | ICD-10-CM

## 2023-08-10 DIAGNOSIS — R479 Unspecified speech disturbances: Secondary | ICD-10-CM

## 2023-08-10 DIAGNOSIS — I639 Cerebral infarction, unspecified: Secondary | ICD-10-CM | POA: Insufficient documentation

## 2023-08-10 HISTORY — DX: Unspecified atrial flutter: I48.92

## 2023-08-10 LAB — URINE DRUG SCREEN, QUALITATIVE (ARMC ONLY)
Amphetamines, Ur Screen: NOT DETECTED
Barbiturates, Ur Screen: NOT DETECTED
Benzodiazepine, Ur Scrn: NOT DETECTED
Cannabinoid 50 Ng, Ur ~~LOC~~: NOT DETECTED
Cocaine Metabolite,Ur ~~LOC~~: NOT DETECTED
MDMA (Ecstasy)Ur Screen: NOT DETECTED
Methadone Scn, Ur: NOT DETECTED
Opiate, Ur Screen: NOT DETECTED
Phencyclidine (PCP) Ur S: NOT DETECTED
Tricyclic, Ur Screen: NOT DETECTED

## 2023-08-10 LAB — CBC WITH DIFFERENTIAL/PLATELET
Abs Immature Granulocytes: 0.01 10*3/uL (ref 0.00–0.07)
Basophils Absolute: 0.1 10*3/uL (ref 0.0–0.1)
Basophils Relative: 1 %
Eosinophils Absolute: 0.3 10*3/uL (ref 0.0–0.5)
Eosinophils Relative: 5 %
HCT: 41.8 % (ref 39.0–52.0)
Hemoglobin: 14.2 g/dL (ref 13.0–17.0)
Immature Granulocytes: 0 %
Lymphocytes Relative: 40 %
Lymphs Abs: 2.1 10*3/uL (ref 0.7–4.0)
MCH: 31.3 pg (ref 26.0–34.0)
MCHC: 34 g/dL (ref 30.0–36.0)
MCV: 92.3 fL (ref 80.0–100.0)
Monocytes Absolute: 0.5 10*3/uL (ref 0.1–1.0)
Monocytes Relative: 9 %
Neutro Abs: 2.3 10*3/uL (ref 1.7–7.7)
Neutrophils Relative %: 45 %
Platelets: 264 10*3/uL (ref 150–400)
RBC: 4.53 MIL/uL (ref 4.22–5.81)
RDW: 12.8 % (ref 11.5–15.5)
WBC: 5.1 10*3/uL (ref 4.0–10.5)
nRBC: 0 % (ref 0.0–0.2)

## 2023-08-10 LAB — COMPREHENSIVE METABOLIC PANEL
ALT: 21 U/L (ref 0–44)
AST: 21 U/L (ref 15–41)
Albumin: 4.4 g/dL (ref 3.5–5.0)
Alkaline Phosphatase: 56 U/L (ref 38–126)
Anion gap: 12 (ref 5–15)
BUN: 24 mg/dL — ABNORMAL HIGH (ref 8–23)
CO2: 25 mmol/L (ref 22–32)
Calcium: 9.4 mg/dL (ref 8.9–10.3)
Chloride: 104 mmol/L (ref 98–111)
Creatinine, Ser: 1.29 mg/dL — ABNORMAL HIGH (ref 0.61–1.24)
GFR, Estimated: >60 mL/min (ref >60)
Glucose, Bld: 106 mg/dL — ABNORMAL HIGH (ref 70–99)
Potassium: 4.6 mmol/L (ref 3.5–5.1)
Sodium: 141 mmol/L (ref 135–145)
Total Bilirubin: 0.9 mg/dL (ref 0.0–1.2)
Total Protein: 7.1 g/dL (ref 6.5–8.1)

## 2023-08-10 LAB — ETHANOL: Alcohol, Ethyl (B): <10 mg/dL (ref <10)

## 2023-08-10 LAB — APTT: aPTT: 25 s (ref 24–36)

## 2023-08-10 LAB — PROTIME-INR
INR: 1 (ref 0.8–1.2)
Prothrombin Time: 13.4 s (ref 11.4–15.2)

## 2023-08-10 MED ORDER — STROKE: EARLY STAGES OF RECOVERY BOOK: Freq: Once | Status: AC

## 2023-08-10 MED ORDER — CYCLOBENZAPRINE HCL 10 MG PO TABS
5.0000 mg | ORAL_TABLET | Freq: Three times a day (TID) | ORAL | Status: DC | PRN
  Administered 2023-08-10: 5 mg via ORAL
  Filled 2023-08-10 (×3): qty 1

## 2023-08-10 MED ORDER — ACETAMINOPHEN 325 MG PO TABS
650.0000 mg | ORAL_TABLET | ORAL | Status: DC | PRN
  Filled 2023-08-10: qty 2

## 2023-08-10 MED ORDER — ASPIRIN 81 MG PO TBEC
81.0000 mg | DELAYED_RELEASE_TABLET | Freq: Every day | ORAL | Status: DC
Start: 2023-08-10 — End: 2023-08-11
  Administered 2023-08-10: 81 mg via ORAL
  Filled 2023-08-10 (×2): qty 1

## 2023-08-10 MED ORDER — ACETAMINOPHEN 650 MG RE SUPP: 650.0000 mg | RECTAL | Status: DC | PRN

## 2023-08-10 MED ORDER — BUTALBITAL-APAP-CAFFEINE 50-325-40 MG PO TABS
1.0000 | ORAL_TABLET | Freq: Four times a day (QID) | ORAL | Status: DC | PRN
  Administered 2023-08-10 – 2023-08-11 (×2): 1 via ORAL
  Filled 2023-08-10 (×2): qty 1

## 2023-08-10 MED ORDER — DIPHENHYDRAMINE HCL 50 MG/ML IJ SOLN
25.0000 mg | Freq: Once | INTRAMUSCULAR | Status: AC
  Administered 2023-08-10: 25 mg via INTRAVENOUS
  Filled 2023-08-10: qty 1

## 2023-08-10 MED ORDER — FAMOTIDINE 20 MG PO TABS
20.0000 mg | ORAL_TABLET | Freq: Every day | ORAL | Status: DC
  Administered 2023-08-10 – 2023-08-11 (×2): 20 mg via ORAL
  Filled 2023-08-10 (×2): qty 1

## 2023-08-10 MED ORDER — ACETAMINOPHEN 500 MG PO TABS
1000.0000 mg | ORAL_TABLET | Freq: Once | ORAL | Status: AC
  Administered 2023-08-10: 1000 mg via ORAL
  Filled 2023-08-10: qty 2

## 2023-08-10 MED ORDER — ACETAMINOPHEN 160 MG/5ML PO SOLN: 650.0000 mg | ORAL | Status: DC | PRN

## 2023-08-10 MED ORDER — SODIUM CHLORIDE 0.9% FLUSH: 3.0000 mL | Freq: Once | INTRAVENOUS | Status: DC

## 2023-08-10 MED ORDER — IOHEXOL 350 MG/ML SOLN
100.0000 mL | Freq: Once | INTRAVENOUS | Status: AC | PRN
  Administered 2023-08-10: 100 mL via INTRAVENOUS

## 2023-08-10 MED ORDER — SODIUM CHLORIDE 0.9% FLUSH: 3.0000 mL | INTRAVENOUS | Status: DC | PRN

## 2023-08-10 MED ORDER — SENNOSIDES-DOCUSATE SODIUM 8.6-50 MG PO TABS: 1.0000 | ORAL_TABLET | Freq: Every evening | ORAL | Status: DC | PRN

## 2023-08-10 MED ORDER — METOCLOPRAMIDE HCL 5 MG/ML IJ SOLN
10.0000 mg | Freq: Once | INTRAMUSCULAR | Status: AC
  Administered 2023-08-10: 10 mg via INTRAVENOUS
  Filled 2023-08-10: qty 2

## 2023-08-10 MED ORDER — HYDRALAZINE HCL 20 MG/ML IJ SOLN: 5.0000 mg | Freq: Four times a day (QID) | INTRAMUSCULAR | Status: DC | PRN

## 2023-08-10 MED ORDER — SODIUM CHLORIDE 0.9% FLUSH
3.0000 mL | Freq: Two times a day (BID) | INTRAVENOUS | Status: DC
  Administered 2023-08-10 – 2023-08-11 (×2): 10 mL via INTRAVENOUS

## 2023-08-10 MED ORDER — ENOXAPARIN SODIUM 40 MG/0.4ML IJ SOSY
40.0000 mg | PREFILLED_SYRINGE | INTRAMUSCULAR | Status: DC
  Administered 2023-08-10: 40 mg via SUBCUTANEOUS
  Filled 2023-08-10: qty 0.4

## 2023-08-10 NOTE — Consult Note (Signed)
1440 Code stroke cart activated 1442 Patient taken to CT 1444 Dr Wilford Corner paged 1445 Dr Wilford Corner in CT to assess patient MRS 0.  TS RN dismissed while pt in CT by Dr Wilford Corner.

## 2023-08-10 NOTE — Progress Notes (Signed)
Chaplain responds to code stroke and finds pt out of room but wife in the room. She expresses distress that pt had to wait to be seen when she was afraid he was having a stroke but says it's in God's hands and she's praying constantly. Chaplain offers support and care.

## 2023-08-10 NOTE — Consult Note (Signed)
NEUROLOGY CONSULT NOTE   Date of service: August 10, 2023 Patient Name: Gordon Garcia MRN:  [id number] DOB:  1955-09-02 Chief Complaint: "Speech difficulty" Requesting Provider: Claybon Jabs, MD  History of Present Illness  Gordon Garcia is a 68 y.o. male with hx of atrial flutter/atrial fibrillation not on anticoagulation by choice, hypertension, presented to the emergency department for evaluation of headache and feeling swimmy headed with thoughts/confusion. He was in the waiting room, noted to have a systolic blood pressure in the 200s.,  Was waiting his turn to be seen and roomed when his speech became somewhat slow.  Wife approached the ED staff who activated a code stroke because of his slow speech and word finding difficulty concerning for aphasia. Further history taking reveals that he woke up around 4 AM this morning with no complaints in normal state of health, around 5 AM started having some headache and intermittent confusion and did not feel right.  Took his blood pressures and it was in the 1 7180 systolic range.  This is what brought him to the ER where the blood pressures were noted to be even higher. His speech was still somewhat comprehensible when he came in but gradually deteriorated although his last known well based on the fact that his complete symptom-free time was prior to 5 AM-hence the last known well will be 5 this morning. He does not report of any chest discomfort tingling numbness or weakness but reports of palpitations.  Reports that he was not convinced that he needs to be on blood thinners for his atrial fibrillation since he is only small burden of atrial fibrillation.  LKW: 5 AM Modified rankin score: 0-Completely asymptomatic and back to baseline post- stroke IV Thrombolysis: Outside the window, likely hypertensive urgency EVT: No ELVO  NIHSS components Score: Comment  1a Level of Conscious 0[x]  1[]  2[]  3[]      1b LOC Questions 0[x]  1[]  2[]       1c LOC  Commands 0[x]  1[]  2[]       2 Best Gaze 0[x]  1[]  2[]       3 Visual 0[x]  1[]  2[]  3[]      4 Facial Palsy 0[x]  1[]  2[]  3[]      5a Motor Arm - left 0[x]  1[]  2[]  3[]  4[]  UN[]    5b Motor Arm - Right 0[x]  1[]  2[]  3[]  4[]  UN[]    6a Motor Leg - Left 0[x]  1[]  2[]  3[]  4[]  UN[]    6b Motor Leg - Right 0[x]  1[]  2[]  3[]  4[]  UN[]    7 Limb Ataxia 0[x]  1[]  2[]  3[]  UN[]     8 Sensory 0[x]  1[]  2[]  UN[]      9 Best Language 0[]  1[x]  2[]  3[]      10 Dysarthria 0[x]  1[]  2[]  UN[]      11 Extinct. and Inattention 0[x]  1[]  2[]       TOTAL: 1      ROS  Comprehensive ROS performed and pertinent positives documented in HPI   Past History   Past Medical History:  Diagnosis Date   Allergic rhinitis    Atrial flutter (HCC)    GERD (gastroesophageal reflux disease)    HTN (hypertension)     Past Surgical History:  Procedure Laterality Date   COLONOSCOPY WITH PROPOFOL N/A 02/14/2021   Procedure: COLONOSCOPY WITH PROPOFOL;  Surgeon: Midge Minium, MD;  Location: [facility] ENDOSCOPY;  Service: Endoscopy;  Laterality: N/A;    Family History: History reviewed. No pertinent family history.  Social History  reports that he has been smoking cigars.  He has never used smokeless tobacco. He reports current alcohol use. He reports that he does not use drugs.  Allergies  Allergen Reactions   Codeine     Other reaction(s): Unknown    Medications   Current Facility-Administered Medications:    sodium chloride flush (NS) 0.9 % injection 3 mL, 3 mL, Intravenous, Once, Gordon Garcia, Gordon Erichsen, MD  Current Outpatient Medications:    Acetaminophen (TYLENOL) 325 MG CAPS, , Disp: , Rfl:    Cholecalciferol (VITAMIN D) 50 MCG (2000 UT) CAPS, , Disp: , Rfl:    famotidine 20 MG/2ML SOLN, , Disp: , Rfl:    Ibuprofen 200 MG CAPS, , Disp: , Rfl:    Olopatadine HCl 0.6 % SOLN, Place into the nose., Disp: , Rfl:    tadalafil (CIALIS) 20 MG tablet, 1 tablet by mouth 1 hour prior to intercourse, Disp: 30 tablet, Rfl: 6   tiZANidine (ZANAFLEX) 4 MG  tablet, Take 4 mg by mouth every 8 (eight) hours as needed., Disp: , Rfl:    triamcinolone (NASACORT) 55 MCG/ACT AERO nasal inhaler, Place into the nose., Disp: , Rfl:    vitamin B-12 (CYANOCOBALAMIN) 1000 MCG tablet, , Disp: , Rfl:   Vitals   Vitals:   2023/08/23 1259 2023-08-23 1300  BP: (!) 206/105   Pulse: 68   Resp: 18   Temp: 98.5 F (36.9 C)   TempSrc: Oral   SpO2: 99%   Weight:  74.8 kg  Height:  5' 8.5" (1.74 m)    Body mass index is 24.72 kg/m.  Physical Exam  General: Awake alert in no distress HEENT: Normocephalic/atraumatic Chest:Clear Cardiovascular: Regular rhythm Neurological exam Awake alert oriented x 3 Reports of a headache but is in no acute distress There is some paucity of speech.  Using short words and short sentences. Fluency was somewhat impaired Naming and repetition is intact No dysarthria Cranial nerves II to XII intact Motor examination reveals no vertical drift but his right arm and leg appear to be somewhat weaker in comparison to the left on individual muscle testing.  This rapidly improved after the CT as he was waiting to get a CT angio done.  There was a mild delay due to machine malfunction and he had to be moved to the other CT scanner.  In those 10 minutes or so his examination rapidly improved but his speech also improved and so did his strength. Sensation intact light touch without extinction Coordination examination reveals no dysmetria.  Labs/Imaging/Neurodiagnostic studies   CBC:  Recent Labs  Lab 08-23-23 1305  WBC 5.1  NEUTROABS 2.3  HGB 14.2  HCT 41.8  MCV 92.3  PLT 264   Basic Metabolic Panel:  Lab Results  Component Value Date   NA 141 08-23-2023   K 4.6 08-23-23   CO2 25 08/23/2023   GLUCOSE 106 (H) 2023/08/23   BUN 24 (H) August 23, 2023   CREATININE 1.29 (H) Aug 23, 2023   CALCIUM 9.4 08-23-23   GFRNONAA >60 Aug 23, 2023   CT Head without contrast(Personally reviewed): Aspects 10.  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  CT angio Head and Neck with contrast(Personally reviewed): No ELVO.  Normal CTA of the neck.  No significant stenosis dissection or aneurysm.  Minimal atherosclerotic calcification within the cavernous internal carotid arteries bilaterally without significant stenosis.  Normal CT perfusion.  3.2 cm nodule in the inferior left lobe of the thyroid-recommend nonemergent thyroid ultrasound  MRI Brain(Personally reviewed): Ordered and pending  ASSESSMENT   DIETRICK BARRIS is a 68 y.o. who has a past medical history of atrial flutter/atrial fibrillation not on anticoagulation, hypertension, hyperlipidemia, presents for evaluation of headache that started this morning along with blood pressures higher than his norm with systolics as high as 200s.  Has had off-and-on confusion intermittently but did notice some right-sided weakness and speech difficulty while waiting in the waiting room.  At the time of examination, had mild aphasia and possibly some right-sided weakness that rapidly improved Differentials at this time: Hypertensive urgency versus left hemispheric stroke/TIA I would recommend admission for further workup  RECOMMENDATIONS  Admit to hospitalist Frequent neurochecks MRI of the brain-if negative, then consider this as a possible TIA versus hypertensive urgency and can work on reducing the blood pressure some-by about 20%. If the MRI of the brain is positive for stroke, then allow permissive hypertension and treat only if systolic blood pressures greater than 220. I will check an echocardiogram I would also check A1c and lipid panel I would keep him on telemetry Aspirin 81 mg High intensity statin for goal LDL less than 70 PT, OT, speech therapy Plan discussed with Dr. Jodie Echevaria Will follow ______________________________________________________________________    Signed, Milon Dikes, MD [facility]

## 2023-08-10 NOTE — H&P (Addendum)
History and Physical    Gordon Garcia JYN:[id number] DOB: October 10, 1955 DOA: 08/10/2023  PCP: Marina Goodell, MD (Confirm with patient/family/NH records and if not entered, this has to be entered at [facility] point of entry) Patient coming from: Home  I have personally briefly reviewed patient's old medical records in [facility]  Chief Complaint: Strokelike symptoms  HPI: Gordon Garcia is a 68 y.o. male with medical history significant of paroxysmal a flutter off anticoagulation, HTN, chronic headache, presented with strokelike symptoms including new onset of speech problems and right-sided weakness this morning.  Patient woke up this morning 5 AM and started to have trouble forming sentences in his brain but denied any trouble understanding other people's talking.  Meantime he felt " right side very heavy" unable to lift his right leg and right arm and his right hand also felt weak.  Meantime he started to feel a dull headache 5/10 frontal, associated with blurry vision.  Symptoms come and goes and lasted about 2 hours and called EMS before noon time.  Symptoms of weakness and speech problems resolved upon arrival in the ED.  Last year patient was diagnosed with paroxysm a flutter and was seen by cardiologist who started patient on Eliquis.  After taking few months patient decided to stop taking it himself " did not feel any difference"  ED Course: Blood pressure significant elevated SBP> 200.  CT head showed no acute intracranial findings, CTA and MRI pending.  Review of Systems: As per HPI otherwise 14 point review of systems negative.    Past Medical History:  Diagnosis Date   Allergic rhinitis    Atrial flutter (HCC)    GERD (gastroesophageal reflux disease)    HTN (hypertension)     Past Surgical History:  Procedure Laterality Date   COLONOSCOPY WITH PROPOFOL N/A 02/14/2021   Procedure: COLONOSCOPY WITH PROPOFOL;  Surgeon: Midge Minium, MD;  Location: [facility] ENDOSCOPY;  Service:  Endoscopy;  Laterality: N/A;     reports that he has been smoking cigars. He has never used smokeless tobacco. He reports current alcohol use. He reports that he does not use drugs.  Allergies  Allergen Reactions   Codeine     Other reaction(s): Unknown    History reviewed. No pertinent family history.   Prior to Admission medications   Medication Sig Start Date End Date Taking? Authorizing Provider  Acetaminophen (TYLENOL) 325 MG CAPS     [provider]  Cholecalciferol (VITAMIN D) 50 MCG (2000 UT) CAPS     [provider]  famotidine 20 MG/2ML SOLN     [provider]  Ibuprofen 200 MG CAPS     [provider]  Olopatadine HCl 0.6 % SOLN Place into the nose. 04/22/18 04/22/19  [provider]  tadalafil (CIALIS) 20 MG tablet 1 tablet by mouth 1 hour prior to intercourse 09/02/19   Stoioff, Verna Czech, MD  tiZANidine (ZANAFLEX) 4 MG tablet Take 4 mg by mouth every 8 (eight) hours as needed. 12/25/18   [provider]  triamcinolone (NASACORT) 55 MCG/ACT AERO nasal inhaler Place into the nose.    [provider]  vitamin B-12 (CYANOCOBALAMIN) 1000 MCG tablet     [provider]    Physical Exam: Vitals:   08/10/23 1300 08/10/23 1520 08/10/23 1530 08/10/23 1630  BP:  (!) 202/96 (!) 196/108 (!) 197/91  Pulse:  98 82 66  Resp:  18 17 19   Temp:      TempSrc:  SpO2:  99% 98% 98%  Weight: 74.8 kg     Height: 5' 8.5" (1.74 m)       Constitutional: NAD, calm, comfortable Vitals:   08/10/23 1300 08/10/23 1520 08/10/23 1530 08/10/23 1630  BP:  (!) 202/96 (!) 196/108 (!) 197/91  Pulse:  98 82 66  Resp:  18 17 19   Temp:      TempSrc:      SpO2:  99% 98% 98%  Weight: 74.8 kg     Height: 5' 8.5" (1.74 m)      Eyes: PERRL, lids and conjunctivae normal ENMT: Mucous membranes are moist. Posterior pharynx clear of any exudate or lesions.Normal dentition.  Neck: normal, supple, no masses, no  thyromegaly Respiratory: clear to auscultation bilaterally, no wheezing, no crackles. Normal respiratory effort. No accessory muscle use.  Cardiovascular: Regular rate and rhythm, no murmurs / rubs / gallops. No extremity edema. 2+ pedal pulses. No carotid bruits.  Abdomen: no tenderness, no masses palpated. No hepatosplenomegaly. Bowel sounds positive.  Musculoskeletal: no clubbing / cyanosis. No joint deformity upper and lower extremities. Good ROM, no contractures. Normal muscle tone.  Skin: no rashes, lesions, ulcers. No induration Neurologic: CN 2-12 grossly intact. Sensation intact, DTR normal. Strength 5/5 in all 4.  Psychiatric: Normal judgment and insight. Alert and oriented x 3. Normal mood.     Labs on Admission: I have personally reviewed following labs and imaging studies  CBC: Recent Labs  Lab 08/10/23 1305  WBC 5.1  NEUTROABS 2.3  HGB 14.2  HCT 41.8  MCV 92.3  PLT 264   Basic Metabolic Panel: Recent Labs  Lab 08/10/23 1305  NA 141  K 4.6  CL 104  CO2 25  GLUCOSE 106*  BUN 24*  CREATININE 1.29*  CALCIUM 9.4   GFR: Estimated Creatinine Clearance: 54.7 mL/min (A) (by C-G formula based on SCr of 1.29 mg/dL (H)). Liver Function Tests: Recent Labs  Lab 08/10/23 1305  AST 21  ALT 21  ALKPHOS 56  BILITOT 0.9  PROT 7.1  ALBUMIN 4.4   No results for input(s): "LIPASE", "AMYLASE" in the last 168 hours. No results for input(s): "AMMONIA" in the last 168 hours. Coagulation Profile: No results for input(s): "INR", "PROTIME" in the last 168 hours. Cardiac Enzymes: No results for input(s): "CKTOTAL", "CKMB", "CKMBINDEX", "TROPONINI" in the last 168 hours. BNP (last 3 results) No results for input(s): "PROBNP" in the last 8760 hours. HbA1C: No results for input(s): "HGBA1C" in the last 72 hours. CBG: No results for input(s): "GLUCAP" in the last 168 hours. Lipid Profile: No results for input(s): "CHOL", "HDL", "LDLCALC", "TRIG", "CHOLHDL", "LDLDIRECT" in  the last 72 hours. Thyroid Function Tests: No results for input(s): "TSH", "T4TOTAL", "FREET4", "T3FREE", "THYROIDAB" in the last 72 hours. Anemia Panel: No results for input(s): "VITAMINB12", "FOLATE", "FERRITIN", "TIBC", "IRON", "RETICCTPCT" in the last 72 hours. Urine analysis: No results found for: "COLORURINE", "APPEARANCEUR", "LABSPEC", "PHURINE", "GLUCOSEU", "HGBUR", "BILIRUBINUR", "KETONESUR", "PROTEINUR", "UROBILINOGEN", "NITRITE", "LEUKOCYTESUR"  Radiological Exams on Admission: CT HEAD CODE STROKE WO CONTRAST` Result Date: 08/10/2023 CLINICAL DATA:  Code stroke. Neuro deficit, acute, stroke suspected. Hypertension. Progressive headache through the day. EXAM: CT HEAD WITHOUT CONTRAST TECHNIQUE: Contiguous axial images were obtained from the base of the skull through the vertex without intravenous contrast. RADIATION DOSE REDUCTION: This exam was performed according to the departmental dose-optimization program which includes automated exposure control, adjustment of the mA and/or kV according to patient size and/or use of iterative reconstruction technique. COMPARISON:  CT head  without contrast 08/10/2023. FINDINGS: Brain: Remote lacunar infarcts involving the left caudate head and medial right frontal lobe are stable. No acute hemorrhage or mass lesion is present. No significant oval change is present. The ventricles are of normal size. No significant extraaxial fluid collection is present. The brainstem and cerebellum are within normal limits. Midline structures are within normal limits. Vascular: No hyperdense vessel or unexpected calcification. Skull: Calvarium is intact. No focal lytic or blastic lesions are present. No significant extracranial soft tissue lesion is present. Sinuses/Orbits: The paranasal sinuses and mastoid air cells are clear. The globes and orbits are within normal limits. ASPECTS [facility] Stroke Program Early CT Score) - Ganglionic level infarction (caudate, lentiform  nuclei, internal capsule, insula, M1-M3 cortex): 7/7 - Supraganglionic infarction (M4-M6 cortex): 3/3 Total score (0-10 with 10 being normal): 10/10 IMPRESSION: 1. No acute intracranial abnormality or significant interval change. 2. Aspects is 10/10. 3. Stable remote lacunar infarcts of the left caudate head and medial right frontal lobe. The above was relayed via text pager to Dr. Wilford Corner on 08/10/2023 at 14:56 . Electronically Signed   By: Marin Roberts M.D.   On: 08/10/2023 14:57   CT Head Wo Contrast Result Date: 08/10/2023 CLINICAL DATA:  Headache, intracranial hypertension features EXAM: CT HEAD WITHOUT CONTRAST TECHNIQUE: Contiguous axial images were obtained from the base of the skull through the vertex without intravenous contrast. RADIATION DOSE REDUCTION: This exam was performed according to the departmental dose-optimization program which includes automated exposure control, adjustment of the mA and/or kV according to patient size and/or use of iterative reconstruction technique. COMPARISON:  None Available. FINDINGS: Brain: No evidence of acute large vascular territory infarction, hemorrhage, hydrocephalus, extra-axial collection or mass lesion/mass effect. Remote left caudate lacunar infarct. Probable additional lacunar infarct in the subcortical right frontal lobe. Vascular: No hyperdense vessel. Skull: No acute fracture. Sinuses/Orbits: Paranasal sinus mucosal thickening. No acute orbital findings. Other: No mastoid effusions. IMPRESSION: 1. No evidence of acute intracranial abnormality. 2. Remote appearing left caudate and right frontal lacunar infarcts. An MRI could better evaluate for acute infarct if clinically warranted. Electronically Signed   By: Feliberto Harts M.D.   On: 08/10/2023 14:34    EKG: Independently reviewed.  Sinus arrhythmia, no acute ST-T changes  Assessment/Plan Principal Problem:   TIA (transient ischemic attack) Active Problems:   CVA (cerebral vascular  accident) (HCC)  (please populate well all problems here in Problem List. (For example, if patient is on BP meds at home and you resume or decide to hold them, it is a problem that needs to be her. Same for CAD, COPD, HLD and so on)  TIA Acute expressive aphasia Right-sided paresis -Most of his GI symptoms resolved after arrival in the ED. -Neurology consultation appreciated -Etiology likely secondary to PAF not coherent with anticoagulation as well as uncontrolled hypertension -Start aspirin 81 mg.  Plan to resume Eliquis on discharge.  MRI to pending to evaluate patient and extension of stroke to stratify risk of reinitiation anticoagulation inpatient versus outpatient -Agreed with permissive hypertension x 24 hours -Echocardiogram -Telemonitoring x 24 hours  HTN emergency -As needed hydralazine -Allow permissive hypertension  PAF -In sinus rhythm, telemonitoring x 24 hours -CHADS2= 3, indication for lifelong anticoagulation.  Explained to patient and his wife at bedside, both expressed understanding and agreed.  DVT prophylaxis: Lovenox Code Status: Full code Family Communication: Wife at bedside Disposition Plan: Expect less than 2 midnight hospital stay Consults called: Neurology Admission status: Telemetry observation   Tametria Aho T  Clois Treanor MD [facility] Pager [phone] 08/10/2023, 4:51 PM

## 2023-08-10 NOTE — ED Provider Triage Note (Signed)
Emergency Medicine Provider Triage Evaluation Note  Gordon Garcia , a 68 y.o. male  was evaluated in triage.  Pt complains of headache, blood pressure.  Patient has not been on hypertension meds is only been controlled with diet and exercise.  Review of Systems  Positive:  Negative:   Physical Exam  BP (!) 206/105 (BP Location: Left Arm)   Pulse 68   Temp 98.5 F (36.9 C) (Oral)   Resp 18   Ht 5' 8.5" (1.74 m)   Wt 74.8 kg   SpO2 99%   BMI 24.72 kg/m  Gen:   Awake, no distress   Resp:  Normal effort  MSK:   Moves extremities without difficulty  Other:    Medical Decision Making  Medically screening exam initiated at 1:02 PM.  Appropriate orders placed.  Gordon Garcia was informed that the remainder of the evaluation will be completed by another provider, this initial triage assessment does not replace that evaluation, and the importance of remaining in the ED until their evaluation is complete.  CT of the head, basic labs, cranial nerves II through XII appear intact, no facial droop weakness etc.   Gordon Ghee, PA-C 08/10/23 1302

## 2023-08-10 NOTE — ED Notes (Addendum)
Verbal orders per Darl Pikes PA. No EKG needed

## 2023-08-10 NOTE — ED Provider Notes (Signed)
[facility] Provider Note    Event Date/Time   First MD Initiated Contact with Patient 08/10/23 1458     (approximate)   History   Hypertension   HPI  Gordon Garcia is a 68 y.o. male paroxysmal A-fib, self DC'd his anticoagulation, presenting with hypertension, headache, intermittent speech difficulties, blurry vision, right-sided weakness.  Last normal was around 5am this morning.  Independent history obtained from wife, patient who complain about a headache earlier this morning, also states that his vision was cloudy or blurry but was unable to fully characterize it.  She thought that his speech was short and that he was not talking much.  States that she thinks some of the symptoms occurred on the way to the hospital at around 1210 but patient states that the speech difficulties occurred earlier after he woke up around 5.  Stroke alert was activated in triage.     Physical Exam   Triage Vital Signs: ED Triage Vitals  Encounter Vitals Group     BP 08/10/23 1259 (!) 206/105     Systolic BP Percentile --      Diastolic BP Percentile --      Pulse Rate 08/10/23 1259 68     Resp 08/10/23 1259 18     Temp 08/10/23 1259 98.5 F (36.9 C)     Temp Source 08/10/23 1259 Oral     SpO2 08/10/23 1259 99 %     Weight 08/10/23 1300 165 lb (74.8 kg)     Height 08/10/23 1300 5' 8.5" (1.74 m)     Head Circumference --      Peak Flow --      Pain Score 08/10/23 1300 8     Pain Loc --      Pain Education --      Exclude from Growth Chart --     Most recent vital signs: Vitals:   08/10/23 1259 08/10/23 1520  BP: (!) 206/105 (!) 202/96  Pulse: 68 98  Resp: 18 18  Temp: 98.5 F (36.9 C)   SpO2: 99% 99%     General: Awake, no distress.  CV:  Good peripheral perfusion.  Resp:  Normal effort.  Abd:  No distention.  Other:  Mild weakness to the right upper extremity, no facial droop, no weakness to the lower extremity, no numbness.   ED Results /  Procedures / Treatments   Labs (all labs ordered are listed, but only abnormal results are displayed) Labs Reviewed  COMPREHENSIVE METABOLIC PANEL - Abnormal; Notable for the following components:      Result Value   Glucose, Bld 106 (*)    BUN 24 (*)    Creatinine, Ser 1.29 (*)    All other components within normal limits  CBC WITH DIFFERENTIAL/PLATELET  PROTIME-INR  APTT  ETHANOL  CBG MONITORING, ED     EKG  Sinus rhythm with PVCs, rate 90,, QTc, T wave flattening in V2, baseline is wandering but no obvious ischemic ST elevation, not significant change compared to prior   RADIOLOGY CT imaging on my interpretation without obvious intracranial hemorrhage.   PROCEDURES:  Critical Care performed: Yes, see critical care procedure note(s)  .Critical Care  Performed by: Claybon Jabs, MD Authorized by: Claybon Jabs, MD   Critical care provider statement:    Critical care time (minutes):  40   Critical care was necessary to treat or prevent imminent or life-threatening deterioration of the following conditions:  CNS  failure or compromise   Critical care was time spent personally by me on the following activities:  Development of treatment plan with patient or surrogate, discussions with consultants, evaluation of patient's response to treatment, examination of patient, ordering and review of laboratory studies, ordering and review of radiographic studies, ordering and performing treatments and interventions, pulse oximetry, re-evaluation of patient's condition and review of old charts    MEDICATIONS ORDERED IN ED: Medications  sodium chloride flush (NS) 0.9 % injection 3 mL (3 mLs Intravenous Not Given 08/10/23 1527)  hydrALAZINE (APRESOLINE) injection 5 mg (has no administration in time range)  acetaminophen (TYLENOL) tablet 1,000 mg (has no administration in time range)  iohexol (OMNIPAQUE) 350 MG/ML injection 100 mL (100 mLs Intravenous Contrast Given 08/10/23 1502)      IMPRESSION / MDM / ASSESSMENT AND PLAN / ED COURSE  I reviewed the triage vital signs and the nursing notes.                              Differential diagnosis includes, but is not limited to, CVA, TIA, hypertensive emergency, electrolyte derangements, press.  Stroke alert was activated, patient is outside tPA window given that his last normal was around 5 AM.  Getting CT CTA, labs, EKG.  Neurology is at bedside.  Consulted with neurology who was looking through the CTA, likely need to be admitted for further stroke management.   Patient's presentation is most consistent with acute presentation with potential threat to life or bodily function.  Independent review of labs, no leukocytosis, H&H is stable, no severe electrolyte derangements, his creatinine is mildly elevated but this is consistent compared to prior.  Neurology recommendations are noted below.  Given his high risk, he needs to be admitted for further management.  Consulted hospitalist was agreeable plan for admission will evaluate the patient.  He is admitted.  Clinical Course as of 08/10/23 1625  Sat Aug 10, 2023  1604 Consulted neurology, he provided recs: RECOMMENDATIONS Admit to hospitalist Frequent neurochecks MRI of the brain-if negative, then consider this as a possible TIA versus hypertensive urgency and can work on reducing the blood pressure some-by about 20%. If the MRI of the brain is positive for stroke, then allow permissive hypertension and treat only if systolic blood pressures greater than 220. I will check an echocardiogram I would also check A1c and lipid panel I would keep him on telemetry Aspirin 81 mg High intensity statin for goal LDL less than 70 PT, OT, speech therapy   [TT]    Clinical Course User Index [TT] Jodie Echevaria, Franchot Erichsen, MD     FINAL CLINICAL IMPRESSION(S) / ED DIAGNOSES   Final diagnoses:  Hypertension, unspecified type  Difficulty with speech  Right arm weakness  Acute nonintractable  headache, unspecified headache type     Rx / DC Orders   ED Discharge Orders     None        Note:  This document was prepared using Dragon voice recognition software and may include unintentional dictation errors.    Claybon Jabs, MD 08/10/23 [id number]

## 2023-08-10 NOTE — ED Notes (Addendum)
CBG 94 @ 3:08pm

## 2023-08-10 NOTE — ED Notes (Signed)
Pt to MRI at this time.

## 2023-08-10 NOTE — ED Triage Notes (Addendum)
Patient c/o hypertension and headache that started this AM and has worsened through day. Reports he feels cloudy.   Does not take blood pressure medication  Reports history of Atrial flutter. Quit taking blood thinner months ago

## 2023-08-10 NOTE — Progress Notes (Signed)
CODE STROKE- PHARMACY COMMUNICATION  Time CODE STROKE called/page received: 1428  Time response to CODE STROKE was made (in person or via phone): 1433  Time Stroke Kit retrieved from [employer] (only if needed): TNK not administered - patient's last known well outside of window   Name of Provider/Nurse contacted: Dr. Wilford Corner  Past Medical History:  Diagnosis Date   Allergic rhinitis    Atrial flutter (HCC)    GERD (gastroesophageal reflux disease)    HTN (hypertension)    Prior to Admission medications   Medication Sig Start Date End Date Taking? Authorizing Provider  Acetaminophen (TYLENOL) 325 MG CAPS     [provider]  Cholecalciferol (VITAMIN D) 50 MCG (2000 UT) CAPS     [provider]  famotidine 20 MG/2ML SOLN     [provider]  Ibuprofen 200 MG CAPS     [provider]  Olopatadine HCl 0.6 % SOLN Place into the nose. 04/22/18 04/22/19  [provider]  tadalafil (CIALIS) 20 MG tablet 1 tablet by mouth 1 hour prior to intercourse 09/02/19   Stoioff, Verna Czech, MD  tiZANidine (ZANAFLEX) 4 MG tablet Take 4 mg by mouth every 8 (eight) hours as needed. 12/25/18   [provider]  triamcinolone (NASACORT) 55 MCG/ACT AERO nasal inhaler Place into the nose.    [provider]  vitamin B-12 (CYANOCOBALAMIN) 1000 MCG tablet     [provider]   Littie Deeds, PharmD Pharmacy Resident  08/10/2023 3:30 PM

## 2023-08-11 ENCOUNTER — Observation Stay (HOSPITAL_BASED_OUTPATIENT_CLINIC_OR_DEPARTMENT_OTHER)
Admit: 2023-08-11 | Discharge: 2023-08-11 | Disposition: A | Discharge status: Home / Self Care | Payer: Managed Care, Other (non HMO) | Attending: Osteopathic Medicine | Admitting: Osteopathic Medicine

## 2023-08-11 ENCOUNTER — Encounter: Payer: Self-pay | Admitting: Internal Medicine

## 2023-08-11 DIAGNOSIS — I10 Essential (primary) hypertension: Secondary | ICD-10-CM | POA: Diagnosis not present

## 2023-08-11 DIAGNOSIS — G459 Transient cerebral ischemic attack, unspecified: Secondary | ICD-10-CM | POA: Diagnosis not present

## 2023-08-11 LAB — URINALYSIS, COMPLETE (UACMP) WITH MICROSCOPIC
Bacteria, UA: NONE SEEN
Bilirubin Urine: NEGATIVE
Glucose, UA: NEGATIVE mg/dL
Hgb urine dipstick: NEGATIVE
Ketones, ur: NEGATIVE mg/dL
Leukocytes,Ua: NEGATIVE
Nitrite: NEGATIVE
Protein, ur: NEGATIVE mg/dL
Specific Gravity, Urine: 1.017 (ref 1.005–1.030)
Squamous Epithelial / HPF: 0 /[HPF] (ref 0–5)
pH: 6 (ref 5.0–8.0)

## 2023-08-11 LAB — LIPID PANEL
Cholesterol: 193 mg/dL (ref 0–200)
HDL: 53 mg/dL (ref >40)
LDL Cholesterol: 123 mg/dL — ABNORMAL HIGH (ref 0–99)
Total CHOL/HDL Ratio: 3.6 {ratio}
Triglycerides: 85 mg/dL (ref <150)
VLDL: 17 mg/dL (ref 0–40)

## 2023-08-11 LAB — HEMOGLOBIN A1C
Hgb A1c MFr Bld: 5.1 % (ref 4.8–5.6)
Mean Plasma Glucose: 99.67 mg/dL

## 2023-08-11 LAB — HIV ANTIBODY (ROUTINE TESTING W REFLEX): HIV Screen 4th Generation wRfx: NONREACTIVE

## 2023-08-11 LAB — ECHOCARDIOGRAM COMPLETE
AR max vel: 2.25 cm2
AV Peak grad: 10 mm[Hg]
Ao pk vel: 1.58 m/s
Area-P 1/2: 3.03 cm2
Height: 69 in
S' Lateral: 2.4 cm
Weight: 2691.38 [oz_av]

## 2023-08-11 MED ORDER — APIXABAN 5 MG PO TABS: 5.0000 mg | ORAL_TABLET | Freq: Two times a day (BID) | ORAL | 0 refills | Status: AC

## 2023-08-11 MED ORDER — ATORVASTATIN CALCIUM 20 MG PO TABS
80.0000 mg | ORAL_TABLET | Freq: Every day | ORAL | Status: DC
  Administered 2023-08-11: 80 mg via ORAL
  Filled 2023-08-11: qty 4

## 2023-08-11 MED ORDER — BUTALBITAL-APAP-CAFFEINE 50-325-40 MG PO TABS: 1.0000 | ORAL_TABLET | ORAL | 0 refills | Status: DC | PRN

## 2023-08-11 MED ORDER — ATORVASTATIN CALCIUM 80 MG PO TABS: 80.0000 mg | ORAL_TABLET | Freq: Every day | ORAL | 0 refills | Status: AC

## 2023-08-11 MED ORDER — APIXABAN 5 MG PO TABS
5.0000 mg | ORAL_TABLET | Freq: Two times a day (BID) | ORAL | Status: DC
  Administered 2023-08-11: 5 mg via ORAL
  Filled 2023-08-11: qty 1

## 2023-08-11 NOTE — ED Notes (Signed)
ED tech transports pt to floor assignment - pt spouse verbalizes comfort of ED room 38 pt neuro unchanged NIH 0

## 2023-08-11 NOTE — Plan of Care (Signed)

## 2023-08-11 NOTE — Progress Notes (Signed)
PT Screen Note  Patient Details Name: Gordon Garcia MRN: [id number] DOB: 1955-10-07   Cancelled Treatment:    Reason Eval/Treat Not Completed: PT screened, no needs identified, will sign off Pt and wife endorse complete resolution of symptoms.  He was able to easily ambulate 200 ft w/o AD at community appropriate speed and and reciprocally negotiated steps w/o UEs.  Pt able to tie shoes, reach outside BOS, form sentences, etc at baseline levels.  No PT needs identified, will sign off.    Malachi Pro, DPT 08/11/2023, 11:01 AM

## 2023-08-11 NOTE — Plan of Care (Signed)
  Problem: Education: Goal: Knowledge of disease or condition will improve Outcome: Progressing   Problem: Ischemic Stroke/TIA Tissue Perfusion: Goal: Complications of ischemic stroke/TIA will be minimized Outcome: Progressing   Problem: Coping: Goal: Will identify appropriate support needs Outcome: Progressing   Problem: Nutrition: Goal: Risk of aspiration will decrease Outcome: Progressing   Problem: Education: Goal: Knowledge of General Education information will improve Description: Including pain rating scale, medication(s)/side effects and non-pharmacologic comfort measures Outcome: Progressing

## 2023-08-11 NOTE — Progress Notes (Addendum)
NEUROLOGY CONSULT FOLLOW UP NOTE   Date of service: August 11, 2023 Patient Name: Gordon Garcia MRN:  [id number] DOB:  05-09-1956  Interval Hx/subjective  Patient was seen and examined.  Continues to complain of some headache which got better with as needed meds and is back again. Also complains of worsening urinary urgency.  Has had it for a while but according to the wife he has been complaining of more urgency recently. Reports he is also been on a ketogenic diet per one of the [facility] keto clinics and would like to continue that. I discussed his LDL numbers being high and recommendations on statin.  He had questions that I answered.  Vitals   Vitals:   08/11/23 0315 08/11/23 0330 08/11/23 0419 08/11/23 0858  BP:   (!) 165/99 (!) 167/100  Pulse: (!) 59 69 (!) 58 90  Resp: 17 12 16 16   Temp:   98.5 F (36.9 C)   TempSrc:   Oral   SpO2: 96% 96% 97% 96%  Weight:   76.3 kg   Height:   5\' 9"  (1.753 m)      Body mass index is 24.84 kg/m.  Physical Exam   General: Awake alert in no distress HEENT: Normocephalic/atraumatic Chest:Clear Cardiovascular: Regular rhythm Neurological exam Awake alert oriented x 3 No aphasia.  No dysarthria-speech normal today Cranial nerves II to XII intact Motor examination reveals 5/5 strength in all fours Sensation intact light touch without extinction Coordination examination reveals no dysmetria.  Medications  Current Facility-Administered Medications:     stroke: early stages of recovery book, , Does not apply, Once, Mikey College T, MD   acetaminophen (TYLENOL) tablet 650 mg, 650 mg, Oral, Q4H PRN **OR** acetaminophen (TYLENOL) 160 MG/5ML solution 650 mg, 650 mg, Per Tube, Q4H PRN **OR** acetaminophen (TYLENOL) suppository 650 mg, 650 mg, Rectal, Q4H PRN, Emeline General, MD   aspirin EC tablet 81 mg, 81 mg, Oral, Daily, Mikey College T, MD, 81 mg at 08/10/23 1717   butalbital-acetaminophen-caffeine (FIORICET) 50-325-40 MG per tablet 1 tablet, 1  tablet, Oral, Q6H PRN, Mikey College T, MD, 1 tablet at 08/10/23 2329   cyclobenzaprine (FLEXERIL) tablet 5 mg, 5 mg, Oral, TID PRN, Mikey College T, MD, 5 mg at 08/10/23 1706   enoxaparin (LOVENOX) injection 40 mg, 40 mg, Subcutaneous, Q24H, Mikey College T, MD, 40 mg at 08/10/23 1717   famotidine (PEPCID) tablet 20 mg, 20 mg, Oral, Daily, Mikey College T, MD, 20 mg at 08/10/23 1705   hydrALAZINE (APRESOLINE) injection 5 mg, 5 mg, Intravenous, Q6H PRN, Emeline General, MD   senna-docusate (Senokot-S) tablet 1 tablet, 1 tablet, Oral, QHS PRN, Mikey College T, MD   sodium chloride flush (NS) 0.9 % injection 3 mL, 3 mL, Intravenous, Once, Tan, Franchot Erichsen, MD   sodium chloride flush (NS) 0.9 % injection 3-10 mL, 3-10 mL, Intravenous, Q12H, Zhang, Ping T, MD, 10 mL at 08/10/23 2109   sodium chloride flush (NS) 0.9 % injection 3-10 mL, 3-10 mL, Intravenous, PRN, Emeline General, MD  Labs and Diagnostic Imaging   CBC:  Recent Labs  Lab 08/10/23 1305  WBC 5.1  NEUTROABS 2.3  HGB 14.2  HCT 41.8  MCV 92.3  PLT 264    Basic Metabolic Panel:  Lab Results  Component Value Date   NA 141 08/10/2023   K 4.6 08/10/2023   CO2 25 08/10/2023   GLUCOSE 106 (H) 08/10/2023   BUN 24 (H) 08/10/2023   CREATININE 1.29 (  H) 08/10/2023   CALCIUM 9.4 08/10/2023   GFRNONAA >60 08/10/2023   Lipid Panel:  Lab Results  Component Value Date   LDLCALC 123 (H) 08/11/2023   HgbA1c:  Lab Results  Component Value Date   HGBA1C 5.1 08/10/2023   Urine Drug Screen:     Component Value Date/Time   LABOPIA NONE DETECTED 08/10/2023 1711   COCAINSCRNUR NONE DETECTED 08/10/2023 1711   LABBENZ NONE DETECTED 08/10/2023 1711   AMPHETMU NONE DETECTED 08/10/2023 1711   THCU NONE DETECTED 08/10/2023 1711   LABBARB NONE DETECTED 08/10/2023 1711    Alcohol Level     Component Value Date/Time   ETH <10 08/10/2023 1703   INR  Lab Results  Component Value Date   INR 1.0 08/10/2023   APTT  Lab Results  Component Value Date    APTT 25 08/10/2023   CT Head without contrast(Personally reviewed): Aspects 10.  Chronic lacunar infarctions in the left caudate head and medial right frontal lobe.   CT angio Head and Neck with contrast(Personally reviewed): No ELVO.  Normal CTA of the neck.  No significant stenosis dissection or aneurysm.  Minimal atherosclerotic calcification within the cavernous internal carotid arteries bilaterally without significant stenosis.  Normal CT perfusion.  3.2 cm nodule in the inferior left lobe of the thyroid-recommend nonemergent thyroid ultrasound  MRI Brain(Personally reviewed): No acute stroke, remote left caudate infarct.  Chronic microvascular ischemic changes  2D echo-pending   Assessment   Gordon Garcia is a 68 y.o. male past history of atrial flutter/fibrillation not on anticoagulation, hypertension, hyperlipidemia presented for evaluation of a headache followed by right-sided facial droop and right facial asymmetry as well as some right-sided weakness that was transient.  Also noted to have blood pressures in the systolic 200s at that time. CT head and CT angiography head and neck were unremarkable for acute process. MRI of the brain was completed overnight-no evidence of stroke Blood pressures in the 160s now. His NIH stroke scale at this examination is 0-his neurological exam is normal. Because of the focality, I think that he might of had a TIA in the setting of hypertensive urgency and/or atrial fibrillation.  Impression: Hypertensive urgency Left hemispheric TIA in the setting of above and atrial fibrillation  Recommendations  Follow on the 2D echocardiogram I would recommend starting Eliquis 5 mg twice daily.  I have ordered the Eliquis and discontinued Lovenox for DVT prophylaxis at the same time. I would recommend starting high intensity statin-atorvastatin 80 for goal LDL less than 70 His A1c is at goal Therapy recommendations Follow-up with outpatient cardiology.   Daughter would like him to follow-up with cardiac electrophysiologist.  He is established at [facility] cardiology practice.  I would recommend that they facilitate EP referral. Will need outpatient neurology follow-up-prefers [facility] neurology.  I would recommend providing a referral for either Dr. Malvin Johns or Dr. Sherryll Burger at [facility] neurology Discussed the importance of maintaining healthy lifestyle, exercising and eating right.  He will continue his discussions about the ketogenic diet with his provider. I had detailed discussion about hypertensive urgency and the importance of being compliant to blood pressure medications. During the conversation he complained of urinary urgency being worse-I have ordered a UA-I would request you to follow and treat if there is any concern for UTI  Please call me back if the echocardiogram is concerning for any abnormal findings Inpatient neurology will be available as needed  Plan relayed to Dr. Lyn Hollingshead ______________________________________________________________________   Signed,  Milon Dikes, MD [facility]

## 2023-08-11 NOTE — Progress Notes (Signed)
Echocardiogram 2D Echocardiogram has been performed.  Gordon Garcia 08/11/2023, 4:26 PM

## 2023-08-11 NOTE — Progress Notes (Signed)
SLP Cancellation Note  Patient Details Name: PAO HAFFEY MRN: [id number] DOB: September 06, 1955   Cancelled treatment:       Reason Eval/Treat Not Completed: SLP screened, no needs identified, will sign off   Donnika Kucher 08/11/2023, 10:28 AM

## 2023-08-11 NOTE — Progress Notes (Signed)
OT Cancellation Note  Patient Details Name: Gordon Garcia MRN: [id number] DOB: [birth year]   Cancelled Treatment:    Reason Eval/Treat Not Completed: OT screened, no needs identified, will sign off. Pt/spouse endorsing resolution of all symptoms. Back to independent baseline. No skilled OT needs identified. Will sign off.   Arman Filter., MPH, MS, OTR/L ascom [phone] 08/11/23, 2:55 PM

## 2023-08-11 NOTE — Discharge Summary (Addendum)
Physician Discharge Summary   Patient: Gordon Garcia MRN: [id number]  DOB: 22-Oct-1955   Admit:     Date of Admission: 08/10/2023 Admitted from: home   Discharge: Date of discharge: 08/11/23 Disposition: Home Condition at discharge: good  CODE STATUS: FULL CODE     Discharge Physician: Sunnie Nielsen, DO [facility]     PCP: Marina Goodell, MD  Recommendations for Outpatient Follow-up:  Follow up with PCP Marina Goodell, MD in 2-4 weeks for blood pressure check, follow thyroid nodule  Follow up with outpatient neurology in 6-8 weeks  Follow up with outpatient cardiology in 2-4 weeks to arrange EP follow up if needed  Please obtain labs/tests: lipid panel, CMP in 4-6 weeks.  Will need thyroid US for incidental nodule      Discharge Diagnoses: Principal Problem:   TIA (transient ischemic attack) Active Problems:   CVA (cerebral vascular accident) [facility])      Hospital course / significant events:   HPI: Gordon Garcia is a 68 y.o. male with medical history significant of paroxysmal a flutter off anticoagulation, HTN, chronic headache, presented with strokelike symptoms including new onset of speech problems and right-sided weakness. Noted trouble forming sentences in his brain but denied any trouble understanding other people's talking.  Meantime he felt "right side very heavy" unable to lift his right leg and right arm and his right hand also felt weak, dull headache 5/10 frontal, associated with blurry vision.  Symptoms come and goes and lasted about 2 hours and called EMS before noon 01/25.  Symptoms of weakness and speech problems resolved upon arrival in the ED.  Last year patient was diagnosed with paroxysmal Aflutter and was seen by cardiologist who started patient on Eliquis.  After taking few months patient decided to stop taking it himself " did not feel any difference"   01/25 - admitted to hospitalist service w/ neurology consult. MRI brain, CTA  H/N no acute concerns - see reports below. 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 Ok to d/c home w/ statin, Eliquis to follow w/ cardiology and neurology outpatient.      Consultants:  Neurology   Procedures/Surgeries: none      ASSESSMENT & PLAN:     TIA Acute expressive aphasia - resolved Right-sided paresis - resolved  Etiology likely secondary to PAF not adherent with anticoagulation as well as uncontrolled hypertension ASA 81 mg - d/c  Eliquis 5 mg bid  Atorvastatin 80 mg daily    HTN emergency BP improved but will need f/u w/ PCP / cardiology    PAF In sinus rhythm, telemonitoring x 24 hours no events CHADS2= 3 indication for lifelong anticoagulation Follow w/ cardiology for EP referral            Discharge Instructions  Allergies as of 08/11/2023       Reactions   Codeine    Other reaction(s): Unknown        Medication List     STOP taking these medications    Olopatadine HCl 0.6 % Soln       TAKE these medications    apixaban 5 MG Tabs tablet Commonly known as: ELIQUIS Take 1 tablet (5 mg total) by mouth 2 (two) times daily.   atorvastatin 80 MG tablet Commonly known as: LIPITOR Take 1 tablet (80 mg total) by mouth daily. Start taking on: August 12, 2023   butalbital-acetaminophen-caffeine 50-325-40 MG tablet Commonly known as:  FIORICET Take 1 tablet by mouth every 4 (four) hours as needed for headache. maximum 2 tablets per day   cyanocobalamin 1000 MCG tablet Commonly known as: VITAMIN B12   famotidine 20 MG/2ML Soln   Ibuprofen 200 MG Caps   tadalafil 20 MG tablet Commonly known as: CIALIS 1 tablet by mouth 1 hour prior to intercourse   tiZANidine 4 MG tablet Commonly known as: ZANAFLEX Take 4 mg by mouth every 8 (eight) hours as needed.   triamcinolone 55 MCG/ACT Aero nasal inhaler Commonly known as: NASACORT Place into the nose.   Tylenol 325 MG Caps Generic drug:  Acetaminophen   Vitamin D 50 MCG (2000 UT) Caps         Follow-up Information     Morene Crocker, MD. Schedule an appointment as soon as possible for a visit.   Specialty: Neurology Why: Dr Malvin Johns or Dr Sherryll Burger for hospital TIA follow up in 6-8 weeks Contact information: [address] [facility] [place] [address] [phone]         Marcina Millard, MD. Schedule an appointment as soon as possible for a visit.   Specialty: Cardiology Why: hosptial follow up for TIA and Afib / anticoagulation, arrange for EP referral Contact information: [address] [facility] [place] [address] [phone]                 Allergies  Allergen Reactions   Codeine     Other reaction(s): Unknown     Subjective: pt reports feels fine this morning, some urinary urgency past few days but this is fairly normal for him (UA below - no concerns). Pt eager for dc home    Discharge Exam: BP (!) 149/87   Pulse 90   Temp 98.5 F (36.9 C) (Oral)   Resp 16   Ht 5\' 9"  (1.753 m)   Wt 76.3 kg   SpO2 96%   BMI 24.84 kg/m  General: Pt is alert, awake, not in acute distress Cardiovascular: RRR, S1/S2 +, no rubs, no gallops Respiratory: CTA bilaterally, no wheezing, no rhonchi Abdominal: Soft, NT, ND, bowel sounds + Extremities: no edema, no cyanosis     The results of significant diagnostics from this hospitalization (including imaging, microbiology, ancillary and laboratory) are listed below for reference.     Microbiology: No results found for this or any previous visit (from the past 240 hours).   Labs: BNP (last 3 results) No results for input(s): "BNP" in the last 8760 hours. Basic Metabolic Panel: Recent Labs  Lab 08/10/23 1305  NA 141  K 4.6  CL 104  CO2 25  GLUCOSE 106*  BUN 24*  CREATININE 1.29*  CALCIUM 9.4   Liver Function Tests: Recent Labs  Lab 08/10/23 1305  AST 21  ALT 21   ALKPHOS 56  BILITOT 0.9  PROT 7.1  ALBUMIN 4.4   No results for input(s): "LIPASE", "AMYLASE" in the last 168 hours. No results for input(s): "AMMONIA" in the last 168 hours. CBC: Recent Labs  Lab 08/10/23 1305  WBC 5.1  NEUTROABS 2.3  HGB 14.2  HCT 41.8  MCV 92.3  PLT 264   Cardiac Enzymes: No results for input(s): "CKTOTAL", "CKMB", "CKMBINDEX", "TROPONINI" in the last 168 hours. BNP: Invalid input(s): "POCBNP" CBG: No results for input(s): "GLUCAP" in the last 168 hours. D-Dimer No results for input(s): "DDIMER" in the last 72 hours. Hgb A1c Recent Labs    08/10/23 1703  HGBA1C 5.1  Lipid Profile Recent Labs    08/11/23 0415  CHOL 193  HDL 53  LDLCALC 123*  TRIG 85  CHOLHDL 3.6   Thyroid function studies No results for input(s): "TSH", "T4TOTAL", "T3FREE", "THYROIDAB" in the last 72 hours.  Invalid input(s): "FREET3" Anemia work up No results for input(s): "VITAMINB12", "FOLATE", "FERRITIN", "TIBC", "IRON", "RETICCTPCT" in the last 72 hours. Urinalysis    Component Value Date/Time   COLORURINE YELLOW (A) 08/11/2023 0914   APPEARANCEUR CLEAR (A) 08/11/2023 0914   LABSPEC 1.017 08/11/2023 0914   PHURINE 6.0 08/11/2023 0914   GLUCOSEU NEGATIVE 08/11/2023 0914   HGBUR NEGATIVE 08/11/2023 0914   BILIRUBINUR NEGATIVE 08/11/2023 0914   KETONESUR NEGATIVE 08/11/2023 0914   PROTEINUR NEGATIVE 08/11/2023 0914   NITRITE NEGATIVE 08/11/2023 0914   LEUKOCYTESUR NEGATIVE 08/11/2023 0914   Sepsis Labs Recent Labs  Lab 08/10/23 1305  WBC 5.1   Microbiology No results found for this or any previous visit (from the past 240 hours). Imaging MR BRAIN WO CONTRAST Result Date: 08/10/2023 CLINICAL DATA:  Neuro deficit, acute, stroke suspected EXAM: MRI HEAD WITHOUT CONTRAST TECHNIQUE: Multiplanar, multiecho pulse sequences of the brain and surrounding structures were obtained without intravenous contrast. COMPARISON:  Same day CT head. FINDINGS: Brain: Remote  lacunar infarct in the left caudate. Prominent suspected dilated perivascular spaces in the right frontal lobe. Additional scattered T2/FLAIR hyperintensity in the white matter compatible with chronic microvascular ischemic disease. No evidence of acute infarct, acute hemorrhage, mass lesion, midline shift or hydrocephalus. Vascular: Major arterial flow voids are maintained at the skull base. Skull and upper cervical spine: Normal marrow signal. Sinuses/Orbits: Mild paranasal sinus mucosal thickening. No acute orbital findings. Other: No mastoid effusions. IMPRESSION: 1. No evidence of acute intracranial abnormality. 2. Remote left caudate infarct and chronic microvascular ischemic disease. Electronically Signed   By: Feliberto Harts M.D.   On: 08/10/2023 17:34   CT ANGIO HEAD NECK W WO CM W PERF (CODE STROKE) Result Date: 08/10/2023 CLINICAL DATA:  Neuro deficit, acute, stroke suspected. EXAM: CT ANGIOGRAPHY HEAD AND NECK CT PERFUSION BRAIN TECHNIQUE: Multidetector CT imaging of the head and neck was performed using the standard protocol during bolus administration of intravenous contrast. Multiplanar CT image reconstructions and MIPs were obtained to evaluate the vascular anatomy. Carotid stenosis measurements (when applicable) are obtained utilizing NASCET criteria, using the distal internal carotid diameter as the denominator. Multiphase CT imaging of the brain was performed following IV bolus contrast injection. Subsequent parametric perfusion maps were calculated using RAPID software. RADIATION DOSE REDUCTION: This exam was performed according to the departmental dose-optimization program which includes automated exposure control, adjustment of the mA and/or kV according to patient size and/or use of iterative reconstruction technique. CONTRAST:  OMNIPAQUE IOHEXOL 350 MG/ML SOLN COMPARISON:  CT head without contrast 08/10/2023. FINDINGS: CTA NECK FINDINGS Aortic arch: A 3 vessel arch configuration  is present. No significant atherosclerotic changes are present. Great vessel origins are within normal limits. Right carotid system: The right common carotid artery is within normal limits bifurcation is unremarkable. The cervical right ICA is normal. Left carotid system: The left common carotid artery is within normal limits. Bifurcation is unremarkable. Cervical left ICA is within normal limits. Vertebral arteries: The vertebral arteries are codominant. Both vertebral arteries originate from the subclavian arteries without significant stenosis. No significant stenosis is present in either vertebral artery in the neck. Skeleton: Uncovertebral and foraminal narrowing is greatest at C3-4 and C4-5 on the right. Other neck: Soft tissues the  neck are otherwise unremarkable. Salivary glands are within normal limits. A 3.2 cm nodule is present in the inferior left lobe of the thyroid. No significant adenopathy is present. Upper chest: The lung apices are clear. The thoracic inlet is within normal limits. Review of the MIP images confirms the above findings CTA HEAD FINDINGS Anterior circulation: Minimal atherosclerotic calcifications are present within the cavernous internal carotid arteries bilaterally. Significant stenosis is present through the ICA termini. The A1 and M1 segments are normal. The anterior communicating artery is patent. MCA bifurcations are normal bilaterally. The ACA and MCA branch vessels are normal bilaterally. No aneurysm is present. Posterior circulation: The PICA origins are visualized and normal. Vertebrobasilar junction basilar artery normal. The superior cerebral arteries originate from the scratched at the superior cerebellar arteries are patent. Both posterior cerebral arteries originate from basilar tip. The PCA branch vessels are within normal limits bilaterally. No aneurysms are present. Venous sinuses: The dural sinuses are patent. The straight sinus and deep cerebral veins are intact.  Cortical veins are within normal limits. No significant vascular malformation is evident. Anatomic variants: None Review of the MIP images confirms the above findings CT Brain Perfusion Findings: ASPECTS: 10/10 CBF (<30%) Volume: 0mL Perfusion (Tmax>6.0s) volume: 0mL Mismatch Volume: 0mL IMPRESSION: 1. Normal CTA of the neck. No significant stenosis, dissection, or aneurysm is present. 2. Minimal atherosclerotic calcifications within the cavernous internal carotid arteries bilaterally without significant stenosis. 3. Normal CT perfusion. 4. 3.2 cm nodule in the inferior left lobe of the thyroid. Recommend non-emergent thyroid ultrasound. Reference: J Am Coll Radiol. 2015 Feb;12(2): 143-50 Electronically Signed   By: Marin Roberts M.D.   On: 08/10/2023 15:43   CT HEAD CODE STROKE WO CONTRAST` Result Date: 08/10/2023 CLINICAL DATA:  Code stroke. Neuro deficit, acute, stroke suspected. Hypertension. Progressive headache through the day. EXAM: CT HEAD WITHOUT CONTRAST TECHNIQUE: Contiguous axial images were obtained from the base of the skull through the vertex without intravenous contrast. RADIATION DOSE REDUCTION: This exam was performed according to the departmental dose-optimization program which includes automated exposure control, adjustment of the mA and/or kV according to patient size and/or use of iterative reconstruction technique. COMPARISON:  CT head without contrast 08/10/2023. FINDINGS: Brain: Remote lacunar infarcts involving the left caudate head and medial right frontal lobe are stable. No acute hemorrhage or mass lesion is present. No significant oval change is present. The ventricles are of normal size. No significant extraaxial fluid collection is present. The brainstem and cerebellum are within normal limits. Midline structures are within normal limits. Vascular: No hyperdense vessel or unexpected calcification. Skull: Calvarium is intact. No focal lytic or blastic lesions are present. No  significant extracranial soft tissue lesion is present. Sinuses/Orbits: The paranasal sinuses and mastoid air cells are clear. The globes and orbits are within normal limits. ASPECTS [facility] Stroke Program Early CT Score) - Ganglionic level infarction (caudate, lentiform nuclei, internal capsule, insula, M1-M3 cortex): 7/7 - Supraganglionic infarction (M4-M6 cortex): 3/3 Total score (0-10 with 10 being normal): 10/10 IMPRESSION: 1. No acute intracranial abnormality or significant interval change. 2. Aspects is 10/10. 3. Stable remote lacunar infarcts of the left caudate head and medial right frontal lobe. The above was relayed via text pager to Dr. Wilford Corner on 08/10/2023 at 14:56 . Electronically Signed   By: Marin Roberts M.D.   On: 08/10/2023 14:57   CT Head Wo Contrast Result Date: 08/10/2023 CLINICAL DATA:  Headache, intracranial hypertension features EXAM: CT HEAD WITHOUT CONTRAST TECHNIQUE: Contiguous axial images were  obtained from the base of the skull through the vertex without intravenous contrast. RADIATION DOSE REDUCTION: This exam was performed according to the departmental dose-optimization program which includes automated exposure control, adjustment of the mA and/or kV according to patient size and/or use of iterative reconstruction technique. COMPARISON:  None Available. FINDINGS: Brain: No evidence of acute large vascular territory infarction, hemorrhage, hydrocephalus, extra-axial collection or mass lesion/mass effect. Remote left caudate lacunar infarct. Probable additional lacunar infarct in the subcortical right frontal lobe. Vascular: No hyperdense vessel. Skull: No acute fracture. Sinuses/Orbits: Paranasal sinus mucosal thickening. No acute orbital findings. Other: No mastoid effusions. IMPRESSION: 1. No evidence of acute intracranial abnormality. 2. Remote appearing left caudate and right frontal lacunar infarcts. An MRI could better evaluate for acute infarct if clinically warranted.  Electronically Signed   By: Feliberto Harts M.D.   On: 08/10/2023 14:34      Time coordinating discharge: over 30 minutes  SIGNED:  Sunnie Nielsen DO [facility]

## 2023-08-11 NOTE — Progress Notes (Signed)
Approximately 1500-- Pt discharged to home. At time of discharge, pt A&O x 4 and VSS. All PIVs removed, sites WDL. Telemetry disconnected and notified. All pt belongings taken with pt--pt verified.

## 2023-08-12 LAB — GLUCOSE, CAPILLARY: Glucose-Capillary: 94 mg/dL (ref 70–99)

## 2023-08-13 ENCOUNTER — Encounter: Payer: Self-pay | Admitting: Emergency Medicine

## 2023-08-13 ENCOUNTER — Emergency Department: Payer: 59

## 2023-08-13 ENCOUNTER — Other Ambulatory Visit: Payer: Self-pay

## 2023-08-13 ENCOUNTER — Observation Stay
Admission: EM | Admit: 2023-08-13 | Discharge: 2023-08-14 | Disposition: A | Discharge status: Home / Self Care | Payer: 59 | Attending: Student | Admitting: Student

## 2023-08-13 ENCOUNTER — Ambulatory Visit: Payer: 59

## 2023-08-13 DIAGNOSIS — R7989 Other specified abnormal findings of blood chemistry: Secondary | ICD-10-CM | POA: Insufficient documentation

## 2023-08-13 DIAGNOSIS — G459 Transient cerebral ischemic attack, unspecified: Secondary | ICD-10-CM | POA: Diagnosis present

## 2023-08-13 DIAGNOSIS — Z7901 Long term (current) use of anticoagulants: Secondary | ICD-10-CM | POA: Diagnosis not present

## 2023-08-13 DIAGNOSIS — Z20822 Contact with and (suspected) exposure to covid-19: Secondary | ICD-10-CM | POA: Diagnosis not present

## 2023-08-13 DIAGNOSIS — I10 Essential (primary) hypertension: Secondary | ICD-10-CM | POA: Diagnosis not present

## 2023-08-13 DIAGNOSIS — K219 Gastro-esophageal reflux disease without esophagitis: Secondary | ICD-10-CM | POA: Diagnosis present

## 2023-08-13 DIAGNOSIS — I4891 Unspecified atrial fibrillation: Secondary | ICD-10-CM | POA: Diagnosis not present

## 2023-08-13 DIAGNOSIS — I639 Cerebral infarction, unspecified: Secondary | ICD-10-CM | POA: Diagnosis present

## 2023-08-13 DIAGNOSIS — R252 Cramp and spasm: Secondary | ICD-10-CM | POA: Insufficient documentation

## 2023-08-13 DIAGNOSIS — R569 Unspecified convulsions: Secondary | ICD-10-CM

## 2023-08-13 DIAGNOSIS — R531 Weakness: Secondary | ICD-10-CM | POA: Diagnosis not present

## 2023-08-13 DIAGNOSIS — E781 Pure hyperglyceridemia: Secondary | ICD-10-CM | POA: Diagnosis present

## 2023-08-13 DIAGNOSIS — R479 Unspecified speech disturbances: Secondary | ICD-10-CM | POA: Insufficient documentation

## 2023-08-13 DIAGNOSIS — I69398 Other sequelae of cerebral infarction: Principal | ICD-10-CM | POA: Insufficient documentation

## 2023-08-13 DIAGNOSIS — I480 Paroxysmal atrial fibrillation: Secondary | ICD-10-CM

## 2023-08-13 LAB — CBC
HCT: 41.3 % (ref 39.0–52.0)
Hemoglobin: 13.9 g/dL (ref 13.0–17.0)
MCH: 31.7 pg (ref 26.0–34.0)
MCHC: 33.7 g/dL (ref 30.0–36.0)
MCV: 94.3 fL (ref 80.0–100.0)
Platelets: 252 10*3/uL (ref 150–400)
RBC: 4.38 MIL/uL (ref 4.22–5.81)
RDW: 13.2 % (ref 11.5–15.5)
WBC: 5.5 10*3/uL (ref 4.0–10.5)
nRBC: 0 % (ref 0.0–0.2)

## 2023-08-13 LAB — TROPONIN I (HIGH SENSITIVITY)
Troponin I (High Sensitivity): 4 ng/L (ref <18)
Troponin I (High Sensitivity): 4 ng/L (ref <18)

## 2023-08-13 LAB — URINALYSIS, W/ REFLEX TO CULTURE (INFECTION SUSPECTED)
Bacteria, UA: NONE SEEN
Bilirubin Urine: NEGATIVE
Glucose, UA: NEGATIVE mg/dL
Hgb urine dipstick: NEGATIVE
Ketones, ur: NEGATIVE mg/dL
Leukocytes,Ua: NEGATIVE
Nitrite: NEGATIVE
Protein, ur: NEGATIVE mg/dL
Specific Gravity, Urine: 1.011 (ref 1.005–1.030)
Squamous Epithelial / HPF: 0 /[HPF] (ref 0–5)
pH: 7 (ref 5.0–8.0)

## 2023-08-13 LAB — COMPREHENSIVE METABOLIC PANEL
ALT: 13 U/L (ref 0–44)
AST: 30 U/L (ref 15–41)
Albumin: 3.9 g/dL (ref 3.5–5.0)
Alkaline Phosphatase: 49 U/L (ref 38–126)
Anion gap: 8 (ref 5–15)
BUN: 22 mg/dL (ref 8–23)
CO2: 24 mmol/L (ref 22–32)
Calcium: 8.9 mg/dL (ref 8.9–10.3)
Chloride: 105 mmol/L (ref 98–111)
Creatinine, Ser: 1.3 mg/dL — ABNORMAL HIGH (ref 0.61–1.24)
GFR, Estimated: >60 mL/min (ref >60)
Glucose, Bld: 109 mg/dL — ABNORMAL HIGH (ref 70–99)
Potassium: 5 mmol/L (ref 3.5–5.1)
Sodium: 137 mmol/L (ref 135–145)
Total Bilirubin: 1.2 mg/dL (ref 0.0–1.2)
Total Protein: 6.6 g/dL (ref 6.5–8.1)

## 2023-08-13 LAB — AMMONIA: Ammonia: 19 umol/L (ref 9–35)

## 2023-08-13 LAB — URINE DRUG SCREEN, QUALITATIVE (ARMC ONLY)
Amphetamines, Ur Screen: NOT DETECTED
Barbiturates, Ur Screen: POSITIVE — AB
Benzodiazepine, Ur Scrn: NOT DETECTED
Cannabinoid 50 Ng, Ur ~~LOC~~: NOT DETECTED
Cocaine Metabolite,Ur ~~LOC~~: NOT DETECTED
MDMA (Ecstasy)Ur Screen: NOT DETECTED
Methadone Scn, Ur: NOT DETECTED
Opiate, Ur Screen: NOT DETECTED
Phencyclidine (PCP) Ur S: NOT DETECTED
Tricyclic, Ur Screen: NOT DETECTED

## 2023-08-13 LAB — DIFFERENTIAL
Abs Immature Granulocytes: 0.01 10*3/uL (ref 0.00–0.07)
Basophils Absolute: 0 10*3/uL (ref 0.0–0.1)
Basophils Relative: 1 %
Eosinophils Absolute: 0.3 10*3/uL (ref 0.0–0.5)
Eosinophils Relative: 6 %
Immature Granulocytes: 0 %
Lymphocytes Relative: 47 %
Lymphs Abs: 2.6 10*3/uL (ref 0.7–4.0)
Monocytes Absolute: 0.6 10*3/uL (ref 0.1–1.0)
Monocytes Relative: 11 %
Neutro Abs: 1.9 10*3/uL (ref 1.7–7.7)
Neutrophils Relative %: 35 %

## 2023-08-13 LAB — ETHANOL: Alcohol, Ethyl (B): <10 mg/dL (ref <10)

## 2023-08-13 LAB — APTT: aPTT: 29 s (ref 24–36)

## 2023-08-13 LAB — RESP PANEL BY RT-PCR (RSV, FLU A&B, COVID)  RVPGX2
Influenza A by PCR: NEGATIVE
Influenza B by PCR: NEGATIVE
Resp Syncytial Virus by PCR: NEGATIVE
SARS Coronavirus 2 by RT PCR: NEGATIVE

## 2023-08-13 LAB — PROTIME-INR
INR: 1.1 (ref 0.8–1.2)
Prothrombin Time: 14.2 s (ref 11.4–15.2)

## 2023-08-13 LAB — MAGNESIUM: Magnesium: 2 mg/dL (ref 1.7–2.4)

## 2023-08-13 LAB — CBG MONITORING, ED: Glucose-Capillary: 97 mg/dL (ref 70–99)

## 2023-08-13 MED ORDER — ONDANSETRON HCL 4 MG PO TABS: 4.0000 mg | ORAL_TABLET | Freq: Four times a day (QID) | ORAL | Status: DC | PRN

## 2023-08-13 MED ORDER — SODIUM CHLORIDE 0.9% FLUSH
3.0000 mL | Freq: Once | INTRAVENOUS | Status: AC
  Administered 2023-08-13: 3 mL via INTRAVENOUS

## 2023-08-13 MED ORDER — CYCLOBENZAPRINE HCL 10 MG PO TABS
5.0000 mg | ORAL_TABLET | Freq: Three times a day (TID) | ORAL | Status: DC | PRN
  Administered 2023-08-13: 5 mg via ORAL
  Filled 2023-08-13 (×2): qty 1

## 2023-08-13 MED ORDER — ROPINIROLE HCL 0.25 MG PO TABS: 0.2500 mg | ORAL_TABLET | Freq: Two times a day (BID) | ORAL | Status: DC | PRN

## 2023-08-13 MED ORDER — STROKE: EARLY STAGES OF RECOVERY BOOK: Freq: Once | Status: DC

## 2023-08-13 MED ORDER — ONDANSETRON HCL 4 MG/2ML IJ SOLN: 4.0000 mg | Freq: Four times a day (QID) | INTRAMUSCULAR | Status: DC | PRN

## 2023-08-13 MED ORDER — LABETALOL HCL 5 MG/ML IV SOLN: 10.0000 mg | INTRAVENOUS | Status: DC | PRN

## 2023-08-13 MED ORDER — ATORVASTATIN CALCIUM 20 MG PO TABS
80.0000 mg | ORAL_TABLET | Freq: Every day | ORAL | Status: DC
  Administered 2023-08-13: 80 mg via ORAL
  Filled 2023-08-13 (×2): qty 4

## 2023-08-13 MED ORDER — GADOBUTROL 1 MMOL/ML IV SOLN
7.0000 mL | Freq: Once | INTRAVENOUS | Status: AC | PRN
  Administered 2023-08-13: 7 mL via INTRAVENOUS

## 2023-08-13 MED ORDER — APIXABAN 5 MG PO TABS
5.0000 mg | ORAL_TABLET | Freq: Two times a day (BID) | ORAL | Status: DC
  Administered 2023-08-13 – 2023-08-14 (×2): 5 mg via ORAL
  Filled 2023-08-13 (×2): qty 1

## 2023-08-13 MED ORDER — ENOXAPARIN SODIUM 40 MG/0.4ML IJ SOSY: 40.0000 mg | PREFILLED_SYRINGE | INTRAMUSCULAR | Status: DC

## 2023-08-13 NOTE — Assessment & Plan Note (Signed)
NSR on EKG  Rate controlled at present Cont eliquis

## 2023-08-13 NOTE — Assessment & Plan Note (Signed)
Cont lipitor

## 2023-08-13 NOTE — Procedures (Signed)
History: 68 year old male presenting with recurrent neurological symptoms  Sedation: None  Patient State: Awake and asleep  Technique: This EEG was acquired with electrodes placed according to the International 10-20 electrode system (including Fp1, Fp2, F3, F4, C3, C4, P3, P4, O1, O2, T3, T4, T5, T6, A1, A2, Fz, Cz, Pz). The following electrodes were missing or displaced: none.   Background: The background consists of intermixed alpha and beta activities. There is a well defined posterior dominant rhythm of 10 hz that attenuates with eye opening.  There is frontally predominant intermittent rhythmic delta activity (FIRDA) which is associated with drowsiness.  Sleep is very briefly recorded with normal appearing structures.   Photic stimulation: Physiologic driving is not performed  EEG Abnormalities: 1) frontally predominant rhythmic delta activity  Clinical Interpretation: This EEG is borderline, with frontally predominant intermittent rhythmic delta activity which can be associated with encephalopathy at times, though the fact that it is associated with drowsiness in this case, I would favor this being an incidental, normal variant.  Further has also been described more commonly in stroke patients than normal controls.    There was no seizure or seizure predisposition recorded on this study. Please note that lack of epileptiform activity on EEG does not preclude the possibility of epilepsy.   Ritta Slot, MD [facility] [phone]  If 7pm- 7am, please page neurology on call as listed in AMION.

## 2023-08-13 NOTE — Progress Notes (Signed)
Eeg done

## 2023-08-13 NOTE — ED Notes (Addendum)
[phone] Code stroke cart activated, LKW 5am, R side weakness, off balance, confusion. Pt had same sx last week and w/u was negative. Pt is on eliquis for a-flutter/TIA/CVA. Last dose at bedtime last night. BP 190/110 HR 84 glucose 97-pt already in CT when activated 0642 TS paged [phone] Dr Dorris Fetch on camera-no TNK d/t anticoagulation

## 2023-08-13 NOTE — ED Provider Triage Note (Signed)
Emergency Medicine Provider Triage Evaluation Note  Gordon Garcia , a 68 y.o. male  was evaluated in triage.  Pt complains of right-sided weakness, difficulty speaking.  Seen and admitted for same 2 days ago with negative brain MRI.  EMS activated code stroke in the field.  Review of Systems  Positive: Right extremity weakness Negative: Chest pain, shortness of breath  Physical Exam  There were no vitals taken for this visit. Gen:   Awake, no distress   Resp:  Normal effort  MSK:   Moves extremities without difficulty  Other:  Slightly weaker right grip strength compared to the left, follows commands  Medical Decision Making  Medically screening exam initiated at 6:39 AM.  Appropriate orders placed.  Gordon Garcia was informed that the remainder of the evaluation will be completed by another provider, this initial triage assessment does not replace that evaluation, and the importance of remaining in the ED until their evaluation is complete.  68 year old male brought in as code stroke; seen 2 days ago for same, hospitalized with negative brain MRI.  Patient seen and cleared for emergent CT head on EMS stretcher.  Patient will be placed in room 10 with teleneurology evaluation.  He will be seen by oncoming ED provider.   Irean Hong, MD 08/13/23 [phone]

## 2023-08-13 NOTE — ED Triage Notes (Signed)
Pt arrived via [facility] as CODE STROKE. Pt reported he woke @ 0500 with no abnormal symptoms. Pt reported weakness once he was getting into shower @ approx 0510. Symptoms reported by EMS: right sided weakness (upper and lower,) AMS and impaired gait. Pt with recent admit for TIA 08/10/23 with d/c on 08/11/23.    EDP assessment on arrival and pt on EMS stretcher to CT.

## 2023-08-13 NOTE — Progress Notes (Addendum)
SLP Cancellation Note  Patient Details Name: Gordon Garcia MRN: [id number] DOB: [birth year]   Cancelled treatment:       Reason Eval/Treat Not Completed:  (chart reviewed)  Per Neurology note, pt is a "18M presents with right weakness and feeling off balanced. Admitted for TIA of the exact same symptoms a few days ago(08/10/2023) and started on Eliquis for hx aflutter then, and is compliant on Eliquis".  Pt has only been admitted to the ED for ~5 hours. ST services will f/u tomorrow for any needs, giving Time for pt to stabilize today.   Noted pt passed his swallow screen w/ NSG and can have an oral diet per MD order.    Jerilynn Som, MS, CCC-SLP Speech Language Pathologist Rehab Services; [facility] [phone] (ascom) Beckham Capistran 08/13/2023, 11:44 AM

## 2023-08-13 NOTE — Assessment & Plan Note (Signed)
Noted recurrent leg cramps which are a longstanding issue per family  Prn flexeril  Check electrolytes  Monitor

## 2023-08-13 NOTE — Progress Notes (Signed)
   08/13/23 1610  Spiritual Encounters  Type of Visit Initial  Care provided to: Pt and family  Conversation partners present during encounter Nurse  Referral source Code page  Reason for visit Code  OnCall Visit Yes  Interventions  Spiritual Care Interventions Made Established relationship of care and support;Compassionate presence  Intervention Outcomes  Outcomes Connection to spiritual care;Awareness around self/spiritual resourses  Spiritual Care Plan  Spiritual Care Issues Still Outstanding No further spiritual care needs at this time (see row info)

## 2023-08-13 NOTE — H&P (Signed)
History and Physical    Patient: Gordon Garcia ZOX:[id number] DOB: 05-07-1956 DOA: 08/13/2023 DOS: the patient was seen and examined on 08/13/2023 PCP: Marina Goodell, MD  Patient coming from: Home  Chief Complaint:  Chief Complaint  Patient presents with   Code Stroke   HPI: Gordon Garcia is a 68 y.o. male with medical history significant of atrial fibrillation, hypertension, headache presenting with TIA symptoms.  Patient noted have been recently admitted January 20 5 January 26 for strokelike symptoms.  Had MRI of the brain with no acute findings however did show some chronic left-sided changes.  Was started on Eliquis as well as high dose Lipitor.  Had expressive aphasia as well as right-sided weakness at that time.  Presented this morning around 6:30 AM with noted right-sided weakness.  Last known normal around 5 AM.  No reported fevers or chills.  No reported nausea or vomiting.  Family has been compliant with home regimen including Eliquis, statin.  No reports of falls.  Mild confusion as well as impaired gait.  Also with leg cramps have been a chronic issue.  At baseline per report. Presented to the ER afebrile, blood pressure 140s to 170s over 80s to 100s.  Satting well on room air.  White count 5.5, hemoglobin 13.9, platelets 252, urinalysis not indicative of infection, UDS positive for barbiturates, COVID flu and RSV negative.  Troponin within normal limits.  Creatinine 1.3 with GFR greater than 60.  CT head within normal limits.  Chest x-ray stable.  MRI of the brain with and without contrast pending. Review of Systems: As mentioned in the history of present illness. All other systems reviewed and are negative. Past Medical History:  Diagnosis Date   Allergic rhinitis    Atrial flutter (HCC)    GERD (gastroesophageal reflux disease)    HTN (hypertension)    Past Surgical History:  Procedure Laterality Date   COLONOSCOPY WITH PROPOFOL N/A 02/14/2021   Procedure: COLONOSCOPY WITH  PROPOFOL;  Surgeon: Midge Minium, MD;  Location: [facility] ENDOSCOPY;  Service: Endoscopy;  Laterality: N/A;   Social History:  reports that he has been smoking cigars. He has never used smokeless tobacco. He reports current alcohol use. He reports that he does not use drugs.  Allergies  Allergen Reactions   Codeine     Other reaction(s): Unknown    History reviewed. No pertinent family history.  Prior to Admission medications   Medication Sig Start Date End Date Taking? Authorizing Provider  Acetaminophen (TYLENOL) 325 MG CAPS     [provider]  apixaban (ELIQUIS) 5 MG TABS tablet Take 1 tablet (5 mg total) by mouth 2 (two) times daily. 08/11/23   Sunnie Nielsen, DO  atorvastatin (LIPITOR) 80 MG tablet Take 1 tablet (80 mg total) by mouth daily. 08/12/23   Sunnie Nielsen, DO  butalbital-acetaminophen-caffeine (FIORICET) [phone] MG tablet Take 1 tablet by mouth every 4 (four) hours as needed for headache. maximum 2 tablets per day 08/11/23   Sunnie Nielsen, DO  Cholecalciferol (VITAMIN D) 50 MCG (2000 UT) CAPS     [provider]  famotidine 20 MG/2ML SOLN     [provider]  Ibuprofen 200 MG CAPS     [provider]  tadalafil (CIALIS) 20 MG tablet 1 tablet by mouth 1 hour prior to intercourse 09/02/19   Stoioff, Verna Czech, MD  tiZANidine (ZANAFLEX) 4 MG tablet Take 4 mg by mouth every 8 (eight) hours as needed. 12/25/18   [provider]  triamcinolone (NASACORT) 55 MCG/ACT AERO nasal inhaler Place into the nose.    [provider]  vitamin B-12 (CYANOCOBALAMIN) 1000 MCG tablet     [provider]    Physical Exam: Vitals:   08/13/23 0648 08/13/23 0900 08/13/23 0930 08/13/23 1000  BP:  (!) 149/100 (!) 168/85 (!) 178/149  Pulse:  (!) 57 60 64  Resp:   16 16  Temp:      TempSrc:      SpO2: 100% 96% 96% 93%   Physical Exam Constitutional:      Appearance: He is normal weight.  HENT:     Head: Normocephalic and  atraumatic.     Nose: Nose normal.     Mouth/Throat:     Mouth: Mucous membranes are moist.  Eyes:     Pupils: Pupils are equal, round, and reactive to light.  Cardiovascular:     Rate and Rhythm: Normal rate and regular rhythm.  Pulmonary:     Effort: Pulmonary effort is normal.  Abdominal:     General: Bowel sounds are normal.  Musculoskeletal:     Comments: Mild R sided weakness    Neurological:     General: No focal deficit present.  Psychiatric:        Mood and Affect: Mood normal.     Data Reviewed:  There are no new results to review at this time.  MR Brain W and Wo Contrast CLINICAL DATA:  Mental status change with unknown cause.  EXAM: MRI HEAD WITHOUT AND WITH CONTRAST  TECHNIQUE: Multiplanar, multiecho pulse sequences of the brain and surrounding structures were obtained without and with intravenous contrast.  CONTRAST:  7mL GADAVIST GADOBUTROL 1 MMOL/ML IV SOLN  COMPARISON:  Three days ago  FINDINGS: Brain: Small foci of restricted diffusion at the right upper putamen and left striatum. 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  Cluster of T2 hyperintense cystic spaces deep to the right cingulate gyrus, best attributed to dilated perivascular spaces.  Mild chronic small vessel ischemic type change in the cerebral white matter and pons.  No abnormal enhancement. No hemorrhage, hydrocephalus, or collection.  Vascular: Normal flow voids and vascular enhancements.  Skull and upper cervical spine: Normal marrow signal  Sinuses/Orbits: Negative  IMPRESSION: Acute small-vessel infarcts at the bilateral basal ganglia.  Electronically Signed   By: Tiburcio Pea M.D.   On: 08/13/2023 09:07 DG Chest 2 View CLINICAL DATA:  68 year old male with history of confusion.  EXAM: CHEST - 2 VIEW  COMPARISON:  Chest x-ray 11/16/2021.  FINDINGS: Lung volumes are normal. No consolidative airspace disease. No pleural effusions. No  pneumothorax. No pulmonary nodule or mass noted. Pulmonary vasculature and the cardiomediastinal silhouette are within normal limits.  IMPRESSION: No radiographic evidence of acute cardiopulmonary disease.  Electronically Signed   By: Trudie Reed M.D.   On: 08/13/2023 07:54 CT HEAD CODE STROKE WO CONTRAST CLINICAL DATA:  Code stroke.  Aphasia.  Right-sided weakness.  EXAM: CT HEAD WITHOUT CONTRAST  TECHNIQUE: Contiguous axial images were obtained from the base of the skull through the vertex without intravenous contrast.  RADIATION DOSE REDUCTION: This exam was performed according to the departmental dose-optimization program which includes automated exposure control, adjustment of the mA and/or kV according to patient size and/or use of iterative reconstruction technique.  COMPARISON:  Brain MRI from 3 days ago  FINDINGS: Brain: Band of low-density crossing the right basal ganglia and internal capsule, stable from prior head CT and interval  brain MRI favoring dilated perivascular spaces. Chronic perforator infarct on the left in the same distribution. Subcortical low-density beneath the right cingulate gyrus is from dilated perivascular spaces by prior brain MRI. No acute hemorrhage, hydrocephalus, mass, or collection.  Vascular: No hyperdense vessel or unexpected calcification.  Skull: Normal. Negative for fracture or focal lesion.  Sinuses/Orbits: No acute finding.  Other: Prelim sent in epic chat.  ASPECTS [facility] Stroke Program Early CT Score)  - Ganglionic level infarction (caudate, lentiform nuclei, internal capsule, insula, M1-M3 cortex): 7  - Supraganglionic infarction (M4-M6 cortex): 3  Total score (0-10 with 10 being normal): 10  IMPRESSION: 1. Stable from head CT 3 days prior.  No acute finding. 2. ASPECTS is 10  Electronically Signed   By: Tiburcio Pea M.D.   On: 08/13/2023 07:11  Lab Results  Component Value Date   WBC 5.5  08/13/2023   HGB 13.9 08/13/2023   HCT 41.3 08/13/2023   MCV 94.3 08/13/2023   PLT 252 08/13/2023   Last metabolic panel Lab Results  Component Value Date   GLUCOSE 109 (H) 08/13/2023   NA 137 08/13/2023   K 5.0 08/13/2023   CL 105 08/13/2023   CO2 24 08/13/2023   BUN 22 08/13/2023   CREATININE 1.30 (H) 08/13/2023   GFRNONAA >60 08/13/2023   CALCIUM 8.9 08/13/2023   PROT 6.6 08/13/2023   ALBUMIN 3.9 08/13/2023   BILITOT 1.2 08/13/2023   ALKPHOS 49 08/13/2023   AST 30 08/13/2023   ALT 13 08/13/2023   ANIONGAP 8 08/13/2023    Assessment and Plan: TIA (transient ischemic attack) Recurrent R sided weakness with noted recent admission 1/25-1/26 for TIA evaluation Of note 1/25 MRI w/ Remote left caudate infarct and chronic microvascular ischemic disease Tele neuro consulted with recommendation for MRI brain wwo contrast, EEG  Will continue home regimen including eliquis and lipitor  PT/OT evaluation  Consider short term rehab after hospital discharge   Leg cramps Noted recurrent leg cramps which are a longstanding issue per family  Prn flexeril  Check electrolytes  Monitor   Atrial fibrillation (HCC) NSR on EKG  Rate controlled at present Cont eliquis   Hypertriglyceridemia Cont lipitor  Hypertension SBP 170s Allow for permissive HTN  Prn IV labetalol for SBP>220 or DBP>110    Gastroesophageal reflux disease without esophagitis PPI       Advance Care Planning:   Code Status: Full Code   Consults: Neurology   Family Communication: Family at the bedside   Severity of Illness: The appropriate patient status for this patient is INPATIENT. Inpatient status is judged to be reasonable and necessary in order to provide the required intensity of service to ensure the patient's safety. The patient's presenting symptoms, physical exam findings, and initial radiographic and laboratory data in the context of their chronic comorbidities is felt to place them at high  risk for further clinical deterioration. Furthermore, it is not anticipated that the patient will be medically stable for discharge from the hospital within 2 midnights of admission.   * I certify that at the point of admission it is my clinical judgment that the patient will require inpatient hospital care spanning beyond 2 midnights from the point of admission due to high intensity of service, high risk for further deterioration and high frequency of surveillance required.*  Author: Floydene Flock, MD 08/13/2023 10:42 AM  For on call review [email or web address].

## 2023-08-13 NOTE — Assessment & Plan Note (Addendum)
Recurrent R sided weakness with noted recent admission 1/25-1/26 for TIA evaluation Of note 1/25 MRI w/ Remote left caudate infarct and chronic microvascular ischemic disease Tele neuro consulted with recommendation for MRI brain wwo contrast, EEG  Will continue home regimen including eliquis and lipitor  PT/OT evaluation  Consider short term rehab after hospital discharge

## 2023-08-13 NOTE — Assessment & Plan Note (Signed)
SBP 170s Allow for permissive HTN  Prn IV labetalol for SBP>220 or DBP>110

## 2023-08-13 NOTE — Assessment & Plan Note (Signed)
PPI ?

## 2023-08-13 NOTE — Consult Note (Signed)
NEUROLOGY CONSULT NOTE   Date of service: August 13, 2023 Patient Name: Gordon Garcia MRN:  [id number] DOB:  06-19-1956 Chief Complaint: "Slurred speech" Requesting Provider: Floydene Flock, MD  History of Present Illness  Gordon Garcia is a 68 y.o. male with hx of hypertension, atrial flutter recently started on anticoagulation, who presents with weakness and slurred speech and difficulty walking that started today.  He was seen by Dr. Dorris Fetch with [facility] and received an MRI which does demonstrate several small subcortical infarcts.  He states that the symptoms feel different than they did a few days ago, except for the five that he had slurred speech both times.  LKW: 5 AM Modified rankin score: 0-Completely asymptomatic and back to baseline post- stroke IV Thrombolysis: No, anticoagulation EVT: No, no LVO   NIHSS components Score: Comment  1a Level of Conscious 0[x]  1[]  2[]  3[]      1b LOC Questions 0[x]  1[]  2[]       1c LOC Commands 0[x]  1[]  2[]       2 Best Gaze 0[x]  1[]  2[]       3 Visual 0[x]  1[]  2[]  3[]      4 Facial Palsy 0[]  1[x]  2[]  3[]      5a Motor Arm - left 0[x]  1[]  2[]  3[]  4[]  UN[]    5b Motor Arm - Right 0[]  1[x]  2[]  3[]  4[]  UN[]    6a Motor Leg - Left 0[x]  1[]  2[]  3[]  4[]  UN[]    6b Motor Leg - Right 0[x]  1[]  2[]  3[]  4[]  UN[]    7 Limb Ataxia 0[x]  1[]  2[]  3[]  UN[]     8 Sensory 0[x]  1[]  2[]  UN[]      9 Best Language 0[x]  1[]  2[]  3[]      10 Dysarthria 0[]  1[x]  2[]  UN[]      11 Extinct. and Inattention 0[x]  1[]  2[]       TOTAL: 4       Past History   Past Medical History:  Diagnosis Date   Allergic rhinitis    Atrial flutter (HCC)    GERD (gastroesophageal reflux disease)    HTN (hypertension)     Past Surgical History:  Procedure Laterality Date   COLONOSCOPY WITH PROPOFOL N/A 02/14/2021   Procedure: COLONOSCOPY WITH PROPOFOL;  Surgeon: Midge Minium, MD;  Location: [facility] ENDOSCOPY;  Service: Endoscopy;  Laterality: N/A;    Family History: History  reviewed. No pertinent family history.  Social History  reports that he has been smoking cigars. He has never used smokeless tobacco. He reports current alcohol use. He reports that he does not use drugs.  Allergies  Allergen Reactions   Codeine     Other reaction(s): Unknown    Medications   Current Facility-Administered Medications:    [START ON 08/14/2023]  stroke: early stages of recovery book, , Does not apply, Once, Floydene Flock, MD   cyclobenzaprine (FLEXERIL) tablet 5 mg, 5 mg, Oral, TID PRN, Floydene Flock, MD, 5 mg at 08/13/23 1122   enoxaparin (LOVENOX) injection 40 mg, 40 mg, Subcutaneous, Q24H, Floydene Flock, MD   labetalol (NORMODYNE) injection 10 mg, 10 mg, Intravenous, Q2H PRN, Floydene Flock, MD   ondansetron [facility]) tablet 4 mg, 4 mg, Oral, Q6H PRN **OR** ondansetron (ZOFRAN) injection 4 mg, 4 mg, Intravenous, Q6H PRN, Floydene Flock, MD  Current Outpatient Medications:    apixaban (ELIQUIS) 5 MG TABS tablet, Take 1 tablet (5 mg total) by mouth 2 (two) times daily., Disp: 60 tablet, Rfl: 0   atorvastatin (LIPITOR) 80 MG tablet,  Take 1 tablet (80 mg total) by mouth daily., Disp: 30 tablet, Rfl: 0   butalbital-acetaminophen-caffeine (FIORICET) 50-325-40 MG tablet, Take 1 tablet by mouth every 4 (four) hours as needed for headache. maximum 2 tablets per day, Disp: 6 tablet, Rfl: 0   Cholecalciferol (VITAMIN D) 50 MCG (2000 UT) CAPS, , Disp: , Rfl:    famotidine 20 MG/2ML SOLN, , Disp: , Rfl:    Ibuprofen 200 MG CAPS, , Disp: , Rfl:    tadalafil (CIALIS) 20 MG tablet, 1 tablet by mouth 1 hour prior to intercourse, Disp: 30 tablet, Rfl: 6   tiZANidine (ZANAFLEX) 4 MG tablet, Take 4 mg by mouth every 8 (eight) hours as needed., Disp: , Rfl:    triamcinolone (NASACORT) 55 MCG/ACT AERO nasal inhaler, Place into the nose., Disp: , Rfl:    vitamin B-12 (CYANOCOBALAMIN) 1000 MCG tablet, , Disp: , Rfl:    Acetaminophen (TYLENOL) 325 MG CAPS, , Disp: , Rfl:   Vitals    Vitals:   08/13/23 1200 08/13/23 1500 08/13/23 1700 08/13/23 1800  BP: (!) 166/92 (!) 165/85 (!) 171/91 (!) 142/122  Pulse: (!) 56 61 (!) 55 74  Resp: 17 17 (!) 25 (!) 26  Temp:  98.3 F (36.8 C)    TempSrc:  Oral    SpO2: 100% 97% 95% 99%    There is no height or weight on file to calculate BMI.  Physical Exam   Constitutional: Appears well-developed and well-nourished.   Neurologic Examination    Neuro: Mental Status: Patient is awake, alert, oriented to person, place, month, year, and situation. Patient is able to give a clear and coherent history. No signs of aphasia or neglect Cranial Nerves: II: Visual Fields are full. Pupils are equal, round, and reactive to light.   III,IV, VI: EOMI without ptosis or diploplia.  V: Facial sensation is symmetric to temperature VII: Facial movement is notable for right facial weakness VIII: hearing is intact to voice X: Uvula elevates symmetrically XII: tongue is midline without atrophy or fasciculations.  Motor: Tone is normal. Bulk is normal.  He has 4/5 proximally and 4 -/5 distally in his right upper extremity, 5/5 elsewhere Sensory: Sensation is symmetric to light touch  in the arms and legs Cerebellar: FNF intact on the left, consistent with weakness on the right        Labs/Imaging/Neurodiagnostic studies   CBC:  Recent Labs  Lab 09-09-23 1305 08/13/23 0644  WBC 5.1 5.5  NEUTROABS 2.3 1.9  HGB 14.2 13.9  HCT 41.8 41.3  MCV 92.3 94.3  PLT 264 252   Basic Metabolic Panel:  Lab Results  Component Value Date   NA 137 08/13/2023   K 5.0 08/13/2023   CO2 24 08/13/2023   GLUCOSE 109 (H) 08/13/2023   BUN 22 08/13/2023   CREATININE 1.30 (H) 08/13/2023   CALCIUM 8.9 08/13/2023   GFRNONAA >60 08/13/2023   Lipid Panel:  Lab Results  Component Value Date   LDLCALC 123 (H) 08/11/2023   HgbA1c:  Lab Results  Component Value Date   HGBA1C 5.1 2023/09/09   Urine Drug Screen:     Component Value  Date/Time   LABOPIA NONE DETECTED 08/13/2023 0945   COCAINSCRNUR NONE DETECTED 08/13/2023 0945   LABBENZ NONE DETECTED 08/13/2023 0945   AMPHETMU NONE DETECTED 08/13/2023 0945   THCU NONE DETECTED 08/13/2023 0945   LABBARB POSITIVE (A) 08/13/2023 0945    Alcohol Level     Component Value Date/Time   ETH <  10 08/13/2023 0644   INR  Lab Results  Component Value Date   INR 1.1 08/13/2023   APTT  Lab Results  Component Value Date   APTT 29 08/13/2023   MRI Brain(Personally reviewed): Several punctate subcortical strokes   ASSESSMENT   Louden Houseworth Huston is a 68 y.o. male  has a past medical history of Allergic rhinitis, Atrial flutter (HCC), GERD (gastroesophageal reflux disease), and HTN (hypertension).  Who presents shortly after previous TIA with new acute stroke.  The appearance I think would be most typical for small vessel disease, but given the multiple strokes in someone with known atrial flutter, I think this is a consideration as well.  Sometimes, shortly after starting anticoagulation a small adherent clot can become dislodged when the connection holding in place is dissolved.  Given this, and given the relatively small size of the strokes, I think I would favor continuing anticoagulation despite there being some small degree of hemorrhagic risk.  Concurrent small vessel disease would be the main other consideration.  RECOMMENDATIONS  Continue Eliquis 5 mg twice daily Permissive hypertension No need for repeat A1c or lipid panel since he just had these done 2 days ago Continue, OT, ST ______________________________________________________________________    Signed, Ritta Slot, MD [facility]

## 2023-08-13 NOTE — ED Provider Notes (Signed)
[facility] Provider Note    Event Date/Time   First MD Initiated Contact with Patient 08/13/23 [phone]     (approximate)   History   Code Stroke   HPI  Gordon Garcia is a 68 y.o. male history of CVA, TIA, a flutter on Eliquis, hypertension, GERD presenting with right-sided weakness as well as speech finding difficulties.  Per wife he woke up around 5 AM and felt like he was having some right sided weakness, felt off balance, speech was a little bit slowed.  Last normal was before bed at 9:30 PM.  Wife noted that he would have a little bit of right upper extremity shaking but no full body seizures.  No fever or chills, no chest pain or shortness of breath, no abdominal pain, new urinary symptoms or diarrhea.  States that he has been compliant with medication, last Eliquis dose was last night.  No reported seizure history.   Independent history obtained from wife, she states that he has no seizure history, did notice that he was sometimes shaking on his right upper extremity, had some shaking of the right lower extremity.  On independent chart review he was just discharged on January 26 for TIA workup, had an MRI done there was negative for acute stroke.  He presented at the time with similar symptoms.  Physical Exam   Triage Vital Signs: ED Triage Vitals [08/13/23 0648]  Encounter Vitals Group     BP      Systolic BP Percentile      Diastolic BP Percentile      Pulse      Resp      Temp      Temp src      SpO2 100 %     Weight      Height      Head Circumference      Peak Flow      Pain Score      Pain Loc      Pain Education      Exclude from Growth Chart     Most recent vital signs: Vitals:   08/13/23 0640 08/13/23 0648  BP: (!) 173/107   Pulse: 91   Resp: 18   Temp: 97.9 F (36.6 C)   SpO2: 97% 100%     General: Awake, no distress.  CV:  Good peripheral perfusion.  Resp:  Normal effort.  Abd:  No distention.  Soft  nontender Other:  No obvious cranial nerve deficits, no sensory deficits, he does have mild weakness to his right lower extremity compared to the left, no obvious weakness to his upper extremities, he does have some slowed speech but no dysarthria.   ED Results / Procedures / Treatments   Labs (all labs ordered are listed, but only abnormal results are displayed) Labs Reviewed  COMPREHENSIVE METABOLIC PANEL - Abnormal; Notable for the following components:      Result Value   Glucose, Bld 109 (*)    Creatinine, Ser 1.30 (*)    All other components within normal limits  RESP PANEL BY RT-PCR (RSV, FLU A&B, COVID)  RVPGX2  PROTIME-INR  APTT  CBC  DIFFERENTIAL  ETHANOL  URINALYSIS, W/ REFLEX TO CULTURE (INFECTION SUSPECTED)  URINE DRUG SCREEN, QUALITATIVE ([facility] ONLY)  AMMONIA  CBG MONITORING, ED  TROPONIN I (HIGH SENSITIVITY)     EKG  Sinus rhythm, rate 73, normal QRS, normal QTc, T wave flattening in 1, V2, V6 no ischemic  ST elevation, T wave changes new compared to prior   RADIOLOGY CT imaging on my interpretation without obvious intracranial hemorrhage   PROCEDURES:  Critical Care performed: Yes, see critical care procedure note(s)  .Critical Care  Performed by: Claybon Jabs, MD Authorized by: Claybon Jabs, MD   Critical care provider statement:    Critical care time (minutes):  40   Critical care was necessary to treat or prevent imminent or life-threatening deterioration of the following conditions:  CNS failure or compromise   Critical care was time spent personally by me on the following activities:  Development of treatment plan with patient or surrogate, discussions with consultants, evaluation of patient's response to treatment, examination of patient, ordering and review of laboratory studies, ordering and review of radiographic studies, ordering and performing treatments and interventions, pulse oximetry, re-evaluation of patient's condition and review of old  charts    MEDICATIONS ORDERED IN ED: Medications  sodium chloride flush (NS) 0.9 % injection 3 mL (3 mLs Intravenous Given 08/13/23 0643)     IMPRESSION / MDM / ASSESSMENT AND PLAN / ED COURSE  I reviewed the triage vital signs and the nursing notes.                              Differential diagnosis includes, but is not limited to, CVA, TIA, seizure, electrolyte derangements, underlying infection, multiple sclerosis.  Patient was activated as a stroke alert by EMS.  Evaluated by teleneurology who recommended admission for MRI with and without contrast as well as EEG.  Patient's presentation is most consistent with acute presentation with potential threat to life or bodily function.  Independent review of labs imaging, no leukocytosis, H&H stable, electrolytes not severely deranged, LFTs are not elevated, his creatinine is mildly elevated but this is similar compared to prior, his ethanol level is not elevated.  Given overall findings and presentation, patient is at high risk and will need to be admitted for further workup and management.  Consulted hospitalist who is agreeable plan for admission will evaluate the patient.  He is admitted.  Clinical Course as of 08/13/23 0740  Tue Aug 13, 2023  0713 Consulted teleneurology, states his symptoms are rapidly improving, speech is a little bit slowed but does not appear confused, not a tPA candidate at this time due being on DOAC and last known well was yesterday, she recommended admission, MRI brain with and without contrast, EEG, also workup for possible metabolic encephalopathy. [TT]  [id number] CT HEAD CODE STROKE WO CONTRAST IMPRESSION: 1. Stable from head CT 3 days prior.  No acute finding. 2. ASPECTS is 10   [TT]    Clinical Course User Index [TT] Jodie Echevaria, Franchot Erichsen, MD     FINAL CLINICAL IMPRESSION(S) / ED DIAGNOSES   Final diagnoses:  Weakness  Difficulty with speech     Rx / DC Orders   ED Discharge Orders     None         Note:  This document was prepared using Dragon voice recognition software and may include unintentional dictation errors.    Claybon Jabs, MD 08/13/23 [phone]

## 2023-08-13 NOTE — Progress Notes (Signed)
OT Cancellation Note  Patient Details Name: Gordon Garcia MRN: [id number] DOB: 04-08-1956   Cancelled Treatment:    Reason Eval/Treat Not Completed: Other (comment). Orders received, chart reviewed. Pt pending medical workup and admit to floor. OT will await results of workup and perform OT eval when appropriate.  Marcelis Wissner L. Broden Holt, OTR/L  08/13/23, 3:27 PM

## 2023-08-13 NOTE — ED Notes (Signed)
CCMD CALLED AT THIS TIME TO INITIATE CARDIAC MONITORING PER ORDER

## 2023-08-13 NOTE — ED Notes (Signed)
Patient reports that  he feels sluggish and slow moving in his right arm and right leg. Patient passed swallow screen.

## 2023-08-13 NOTE — Consult Note (Signed)
[facility] Consult Services   Patient Name:   Gordon Garcia, Gordon Garcia Date of Birth:   1956/06/17 Identification Number:   MRN - [id number] Date of Service:   08/13/2023 06:42:28  Diagnosis:       G93.49 - Encephalopathy Multifactorial  Impression:      6M presents with right weakness and feeling off balanced. Admitted for TIA of the exact same symptoms a few days ago) and started on Eliquis for hx aflutter and is compliant on Eliquis. Not a thrombolytic candidate 2/2 on DOAC. Not an IR candidate 2/2. Rec toxic metabolic infectious workup (U/A, COVID/flu +/-RSV, chest xray, UDS, NH3 etc as per ED MD/primary team discretion), rec admission for   repeat MRI brain wwo for workup and routine EEG to eval for sz as etiology of symptoms.  Our recommendations are outlined below.  Recommendations:        Stroke/Telemetry Floor       Neuro Checks       Bedside Swallow Eval       DVT Prophylaxis       IV Fluids, Normal Saline       Head of Bed 30 Degrees       Euglycemia and Avoid Hyperthermia (PRN Acetaminophen)       cont Eliquis       Routine EEG       MRI brain wwo  Sign Out:       Discussed with Emergency Department Provider    ------------------------------------------------------------------------------  Advanced Imaging: Advanced Imaging Deferred because:  nih low an dsame symptoms as from a few days ago with neg CTA imaging   Metrics: Last Known Well: 08/13/2023 05:00:00 Dispatch Time: 08/13/2023 40:98:11 Arrival Time: 08/13/2023 06:36:00 Initial Response Time: 08/13/2023 06:44:19 Symptoms: right weakness and was off balanced with confusion. Initial patient interaction: 08/13/2023 06:46:59 NIHSS Assessment Completed: 08/13/2023 06:50:19 Patient is not a candidate for Thrombolytic. Thrombolytic Medical Decision: 08/13/2023 06:53:59 Patient was not deemed candidate for Thrombolytic because of following reasons: Use of NOAC in last 48 hrs. .  I  personally Reviewed the CT Head and it Showed no acute pathology  Primary Provider Notified of Diagnostic Impression and Management Plan on: 08/13/2023 07:08:07    ------------------------------------------------------------------------------  History of Present Illness: Patient is a 68 year old Male.  Patient was brought by EMS for symptoms of right weakness and was off balanced with confusion. 6M presented with right weakness and was off balanced. Woke up normal and got out of shower then had right weakness and was off balanced. Feels like he is speaking slowly. No dizziness or headache.  Had TIA over weekend with the same symptoms.  On Eliquis (last dose was last night).  LKN: before bed at 9:30pm   Past Medical History:      Hypertension      Hyperlipidemia      Stroke      There is no history of Seizures Other PMH:  afib  Medications:  Anticoagulant use:  Yes Eliquis No Antiplatelet use Reviewed EMR for current medications  Allergies:  Reviewed  Social History: Smoking: No Alcohol Use: No Drug Use: No  Family History:  There is no family history of premature cerebrovascular disease pertinent to this consultation  ROS : 14 Points Review of Systems was performed and was negative except mentioned in HPI.  Past Surgical History: There Is No Surgical History Contributory To Today's Visit    Examination: BP(190/110), Pulse(84), Blood Glucose(97) 1A: Level of Consciousness - Alert; keenly responsive +  0 1B: Ask Month and Age - Both Questions Right + 0 1C: Blink Eyes & Squeeze Hands - Performs Both Tasks + 0 2: Test Horizontal Extraocular Movements - Normal + 0 3: Test Visual Fields - No Visual Loss + 0 4: Test Facial Palsy (Use Grimace if Obtunded) - Normal symmetry + 0 5A: Test Left Arm Motor Drift - No Drift for 10 Seconds + 0 5B: Test Right Arm Motor Drift - No Drift for 10 Seconds + 0 6A: Test Left Leg Motor Drift - No Drift for 5 Seconds + 0 6B:  Test Right Leg Motor Drift - No Drift for 5 Seconds + 0 7: Test Limb Ataxia (FNF/Heel-Shin) - No Ataxia + 0 8: Test Sensation - Normal; No sensory loss + 0 9: Test Language/Aphasia - Mild-Moderate Aphasia: Some Obvious Changes, Without Significant Limitation + 1 10: Test Dysarthria - Normal + 0 11: Test Extinction/Inattention - No abnormality + 0  NIHSS Score: 1   Pre-Morbid Modified Rankin Scale: 1 Points = No significant disability despite symptoms; able to carry out all usual duties and activities  Spoke with : Dr. Jodie Echevaria  This consult was conducted in real time using interactive audio and Immunologist. Patient was informed of the technology being used for this visit and agreed to proceed. Patient located in hospital and provider located at home/office setting.   Patient is being evaluated for possible acute neurologic impairment and high probability of imminent or life-threatening deterioration. I spent total of 27 minutes providing care to this patient, including time for face to face visit via telemedicine, review of medical records, imaging studies and discussion of findings with providers, the patient and/or family.   Dr Amedeo Kinsman   [facility] For Inpatient follow-up with [facility] physician please call [facility] at [phone]. As we are not an outpatient service for any post hospital discharge needs please contact the hospital for assistance. If you have any questions for the [facility] physicians or need to reconsult for clinical or diagnostic changes please contact us via [facility] at [phone].

## 2023-08-14 ENCOUNTER — Other Ambulatory Visit: Payer: Self-pay

## 2023-08-14 DIAGNOSIS — I639 Cerebral infarction, unspecified: Secondary | ICD-10-CM | POA: Diagnosis not present

## 2023-08-14 LAB — COMPREHENSIVE METABOLIC PANEL
ALT: 20 U/L (ref 0–44)
AST: 21 U/L (ref 15–41)
Albumin: 3.9 g/dL (ref 3.5–5.0)
Alkaline Phosphatase: 53 U/L (ref 38–126)
Anion gap: 12 (ref 5–15)
BUN: 27 mg/dL — ABNORMAL HIGH (ref 8–23)
CO2: 22 mmol/L (ref 22–32)
Calcium: 9.4 mg/dL (ref 8.9–10.3)
Chloride: 105 mmol/L (ref 98–111)
Creatinine, Ser: 1.07 mg/dL (ref 0.61–1.24)
GFR, Estimated: >60 mL/min (ref >60)
Glucose, Bld: 106 mg/dL — ABNORMAL HIGH (ref 70–99)
Potassium: 4 mmol/L (ref 3.5–5.1)
Sodium: 139 mmol/L (ref 135–145)
Total Bilirubin: 0.7 mg/dL (ref 0.0–1.2)
Total Protein: 6.8 g/dL (ref 6.5–8.1)

## 2023-08-14 LAB — CBC
HCT: 41.8 % (ref 39.0–52.0)
Hemoglobin: 14.2 g/dL (ref 13.0–17.0)
MCH: 31.3 pg (ref 26.0–34.0)
MCHC: 34 g/dL (ref 30.0–36.0)
MCV: 92.1 fL (ref 80.0–100.0)
Platelets: 270 10*3/uL (ref 150–400)
RBC: 4.54 MIL/uL (ref 4.22–5.81)
RDW: 13 % (ref 11.5–15.5)
WBC: 5.8 10*3/uL (ref 4.0–10.5)
nRBC: 0 % (ref 0.0–0.2)

## 2023-08-14 MED ORDER — METHOCARBAMOL 500 MG PO TABS
500.0000 mg | ORAL_TABLET | Freq: Three times a day (TID) | ORAL | 0 refills | Status: DC | PRN
  Filled 2023-08-14: qty 90, 30d supply, fill #0

## 2023-08-14 MED ORDER — HYDRALAZINE HCL 50 MG PO TABS
50.0000 mg | ORAL_TABLET | Freq: Three times a day (TID) | ORAL | 0 refills | Status: DC | PRN
  Filled 2023-08-14: qty 90, 30d supply, fill #0

## 2023-08-14 MED ORDER — ROPINIROLE HCL 0.5 MG PO TABS
0.5000 mg | ORAL_TABLET | Freq: Every evening | ORAL | 0 refills | Status: DC | PRN
  Filled 2023-08-14: qty 30, 30d supply, fill #0

## 2023-08-14 NOTE — ED Notes (Addendum)
This RN at bedside, provide supplies for ADLs including wash cloth, toothbrush, and toothpaste. Breakfast meal tray also given at this time.

## 2023-08-14 NOTE — TOC CM/SW Note (Signed)
     [facility] REFERRAL        Occupational Therapy * Physical Therapy * Speech Therapy                           DATE 08/14/2023  PATIENT NAME Gordon Garcia  PATIENT MRN [id number]       DIAGNOSIS/DIAGNOSIS CODE CVA  DATE OF DISCHARGE: 08/14/2023       PRIMARY CARE PHYSICIAN: Marina Goodell, MD  PCP PHONE/FAX: [phone]      Dear Provider (Name: [facility] outpatient __  Fax: [phone]   I certify that I have examined this patient and that occupational/physical/speech therapy is necessary on an outpatient basis.    The patient has expressed interest in completing their recommended course of therapy at your  location.  Once a formal order from the patient's primary care physician has been obtained, please  contact him/her to schedule an appointment for evaluation at your earliest convenience.   [ X]  Physical Therapy Evaluate and Treat  [  ]  Occupational Therapy Evaluate and Treat  [  ]  Speech Therapy Evaluate and Treat         The patient's primary care physician (listed above) must furnish and be responsible for a formal order such that the recommended services may be furnished while under the primary physician's care, and that the plan of care will be established and reviewed every 30 days (or more often if condition necessitates).

## 2023-08-14 NOTE — Evaluation (Addendum)
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   Allergic rhinitis due to pollen 05/06/2014   Gastroesophageal reflux disease without esophagitis 05/06/2014   Hypertension 05/06/2014   Hypertriglyceridemia 05/06/2014   Past Medical History:  Past Medical History:  Diagnosis Date   Allergic rhinitis    Atrial flutter (HCC)    GERD (gastroesophageal reflux disease)    HTN (hypertension)    Past Surgical History:  Past Surgical History:  Procedure Laterality Date   COLONOSCOPY WITH PROPOFOL N/A 02/14/2021   Procedure: COLONOSCOPY WITH PROPOFOL;  Surgeon: Midge Minium, MD;  Location: [facility] ENDOSCOPY;  Service: Endoscopy;  Laterality: N/A;   HPI:  Pt is a 68 year old male presents with weakness and slurred speech and difficulty walking.   MRI revealed: Mild chronic small vessel ischemic type change in the cerebral white  matter and pons.  Acute small-vessel infarcts at the bilateral basal ganglia.    PMH significant for recentTIA, Allergic rhinitis, Atrial flutter (HCC), GERD (gastroesophageal reflux disease), and HTN (hypertension).   Assessment / Plan / Recommendation Clinical Impression   Pt seen for informal assessment of speech-language abilities today. Family present in pt's room. Pt awake, sitting in chair. Verbal w/ Dysphonia noted, and A/O x4. Family present. Quite pleasant.Pt eager to go home today.  On RA, afebrile. WBC WNL.   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 Pt's speech was mainly fluent with instances of anomia c/b hesitations, paucity of speech, and inconsistent use of circumlocution to repair breakdowns during some speech tasks -- most often noted in verbal tasks w/out visual/tangible cue. Pt with intact confrontation object naming, picture description, and repetition. Slightly impaired divergent description during speech tasks appreciated; convergent/divergent naming itself WFL. Additionally, there was subjective reports of "slurred" speech. Very minimal/seldomly occuring articulatory imprecision noted. Pt's speech was 100% intelligible to this unfamiliar listener. However, pt's VOLUME of speech is LOW impacting his intelligibility. Pt with good awareness of his speech changes; Family indicated he "talks much louder at home". Practiced using speech strategies of: BIG, SLOW, LOUD speech. Also modeled/practiced Over-articulation of speech with pt and encouraged use of. He demonstrated intact auditory comprehension for [facility] complex information (e.g. yes/no questions, 2-step/multistep commands, ID characteristics). Cognitive tasks of orientation, problem-solving and reasoning, and recall of 3/3 bits of information were [facility].   OM Exam revealed slight-min R labial decreased tone; improved ROM/tone w/ effort during exs. No unilateral lingual weakness nor decreased tone/ROM noted.  Pt seemed slightly restless during the assessment; stated he was eager to go home today. Pt also endorsed Min anxiety with confrontational language engagement, new situations, and the impact of this illness/admit to the Hospital. Family present and agreed.    Pt participated in skilled SLP services targeting functional expressive communication including, but not limited to, education re: principles of neuroplasticity, fatigue factor, changes to speech/language following neurological change/CVA, positive prognostic indicators for speech/language improvement, communication  strategies, self-advocacy skills(asking for Time and/or Repetition), use of an Incentive Spirometer to improve diaphragmatic breathing  strength, and SLP POC. Pt/Family made aware of results of assessment and recommendation for f/u w/ Outpatient services post D/C for more formal evaluation. Handouts given on Dysarthria and exs; Incentive Spirometer w/ instruction of use. Pt/Family verbalized understanding; questions answered.  MD/NSG/TOC made aware of results of assessment, recommendations, recommendation for Outpatient f/u.      SLP Assessment  SLP Recommendation/Assessment: All further Speech Lanaguage Pathology  needs can be addressed in the next venue of care SLP Visit Diagnosis: Aphasia (R47.01);Aphonia (R49.1)    Recommendations for follow up therapy are one component of a multi-disciplinary discharge planning process, led by the attending physician.  Recommendations may be updated based on patient status, additional functional criteria and insurance authorization.    Follow Up Recommendations  Outpatient SLP    Assistance Recommended at Discharge  PRN  Functional Status Assessment Patient has had a recent decline in their functional status and demonstrates the ability to make significant improvements in function in a reasonable and predictable amount of time.  Frequency and Duration  (TBD)   (TBD)      SLP Evaluation Cognition  Overall Cognitive Status: Impaired/Different from baseline Arousal/Alertness: Awake/alert Orientation Level: Oriented X4 Year: 2025 Month: January Attention: Focused;Sustained Focused Attention: Appears intact Sustained Attention: Appears intact Memory: Appears intact (3/3 items) Awareness: Appears intact Problem Solving: Appears intact Executive Function: Reasoning;Decision Making Reasoning: Appears intact Decision Making: Appears intact Behaviors: Restless (min) Safety/Judgment: Appears intact Comments: knew to call "911" in an emergency        Comprehension  Auditory Comprehension Overall Auditory Comprehension: Appears within functional limits for tasks assessed Yes/No Questions: Within Functional Limits Commands: Within Functional Limits (2 step) Conversation: Complex Other Conversation Comments: hestancies EffectiveTechniques: Slowed Technical sales engineer Discrimination: Not tested Reading Comprehension Reading Status: Not tested    Expression Expression Primary Mode of Expression: Verbal Verbal Expression Overall Verbal Expression: Impaired (min) Initiation: No impairment Automatic Speech:  (WFL) Level of Generative/Spontaneous Verbalization: Conversation Repetition: No impairment Naming: No impairment (hesitancies noted in conversation) Pragmatics: No impairment Non-Verbal Means of Communication: Not applicable Written Expression Dominant Hand: Right Written Expression: Not tested   Oral / Motor  Oral Motor/Sensory Function Overall Oral Motor/Sensory Function: Mild impairment Facial ROM: Reduced right;Suspected CN VII (facial) dysfunction (slight-mild) Facial Symmetry: Abnormal symmetry right;Suspected CN VII (facial) dysfunction (slight-mild) Facial Strength: Within Functional Limits Facial Sensation: Within Functional Limits Lingual ROM: Within Functional Limits Lingual Symmetry: Within Functional Limits Lingual Strength: Within Functional Limits Lingual Sensation: Within Functional Limits Velum: Within Functional Limits Mandible: Within Functional Limits Motor Speech Overall Motor Speech: Impaired Respiration: Within functional limits Phonation: Low vocal intensity Resonance: Within functional limits (grossly) Articulation: Within functional limitis Intelligibility: Intelligibility reduced Word: 75-100% accurate Phrase: 75-100% accurate Sentence: 50-74% accurate Conversation: 50-74% accurate Motor Planning: Witnin functional limits Motor Speech Errors: Not  applicable Effective Techniques: Slow rate;Increased vocal intensity;Over-articulate                 Jerilynn Som, MS, CCC-SLP Speech Language Pathologist Rehab Services; [facility] [phone] (ascom)  Keevin Panebianco 08/14/2023, 3:07 PM

## 2023-08-14 NOTE — ED Notes (Signed)
Report received from [place], [place].

## 2023-08-14 NOTE — ED Notes (Signed)
PT/OT at bedside at this time.

## 2023-08-14 NOTE — Evaluation (Signed)
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    PMH significant for recentTIA, Allergic rhinitis, Atrial flutter (HCC), GERD (gastroesophageal reflux disease), and HTN (hypertension).   Clinical Impression  Patient received in recliner, wife in room. He presents with flat affect and low speaking volume. He has normal strength and sensation in R LE. Patient is able to stand and ambulate without AD 200 feet with supervision/cga. He does veer to his right during ambulation. Patient will continue to benefit from skilled PT to improve balance and return to baseline function.        If plan is discharge home, recommend the following: Assist for transportation;Help with stairs or ramp for entrance   Can travel by private vehicle    yes    Equipment Recommendations None recommended by PT  Recommendations for Other Services       Functional Status Assessment Patient has had a recent decline in their functional status and demonstrates the ability to make significant improvements in function in a reasonable and predictable amount of time.     Precautions / Restrictions Precautions Precautions: Fall Restrictions Weight Bearing Restrictions Per Provider Order: No      Mobility  Bed Mobility               General bed mobility comments: NT in recliner pre/post session    Transfers Overall transfer level: Needs assistance Equipment used: None Transfers: Sit to/from Stand Sit to Stand: Supervision                Ambulation/Gait Ambulation/Gait assistance: Supervision, Contact guard assist Gait Distance (Feet): 200 Feet Assistive device: None Gait Pattern/deviations: Step-through pattern, Decreased step length -  right, Decreased step length - left, Decreased stride length Gait velocity: decr     General Gait Details: occasional leaning/veering to his right with ambulation, but otherwise no LOB, no overt weakness/coordination difficulties of LEs noted during gait.  Stairs            Wheelchair Mobility     Tilt Bed    Modified Rankin (Stroke Patients Only)       Balance Overall balance assessment: Needs assistance Sitting-balance support: Feet supported Sitting balance-Leahy Scale: Normal     Standing balance support: No upper extremity supported, During functional activity Standing balance-Leahy Scale: Fair Standing balance comment: R lateral lean while amb                             Pertinent Vitals/Pain Pain Assessment Pain Assessment: No/denies pain    Home Living Family/patient expects to be discharged to:: Private residence Living Arrangements: Spouse/significant other Available Help at Discharge: Family;Available PRN/intermittently Type of Home: House Home Access: Stairs to enter Entrance Stairs-Rails: None Entrance Stairs-Number of Steps: 4   Home Layout: One level Home Equipment: None      Prior Function Prior Level of Function : Independent/Modified Independent;Working/employed;Driving                     Extremity/Trunk Assessment   Upper Extremity Assessment Upper Extremity Assessment: Defer to OT evaluation RUE Deficits / Details: AROM WFL; MMT 4+/5 shoulder flexion, 4/5 elbow flexion/extension, wrist flexion/extension 4/5, mildly weak grip strength, mildly impaired FMC/dexterity;  educated pt and wife re: HEP for improved function RUE Sensation: WNL (kinesthesia/proprioception appear WFL will continue to assess) RUE Coordination: decreased fine motor    Lower Extremity Assessment Lower Extremity Assessment: Overall WFL for tasks assessed    Cervical / Trunk Assessment Cervical / Trunk Assessment: Normal  Communication    Communication Communication: Difficulty following commands/understanding Following commands: Follows one step commands with increased time Cueing Techniques: Verbal cues;Visual cues  Cognition Arousal: Alert Behavior During Therapy: Flat affect, WFL for tasks assessed/performed Overall Cognitive Status: Impaired/Different from baseline Area of Impairment: Problem solving, Following commands                       Following Commands: Follows one step commands with increased time     Problem Solving: Slow processing, Requires verbal cues          General Comments General comments (skin integrity, edema, etc.): vss throughout    Exercises     Assessment/Plan    PT Assessment Patient needs continued PT services  PT Problem List Decreased balance;Decreased mobility;Decreased safety awareness;Decreased coordination       PT Treatment Interventions Gait training;Stair training;Functional mobility training;Therapeutic exercise;Therapeutic activities;Balance training;Patient/family education    PT Goals (Current goals can be found in the Care Plan section)  Acute Rehab PT Goals Patient Stated Goal: to improve PT Goal Formulation: With patient/family Time For Goal Achievement: 08/28/23 Potential to Achieve Goals: Good    Frequency Min 1X/week     Co-evaluation PT/OT/SLP Co-Evaluation/Treatment: Yes Reason for Co-Treatment: Necessary to address cognition/behavior during functional activity;For patient/therapist safety;To address functional/ADL transfers PT goals addressed during session: Mobility/safety with mobility;Balance         AM-PAC PT "6 Clicks" Mobility  Outcome Measure Help needed turning from your back to your side while in a flat bed without using bedrails?: A Little Help needed moving from lying on your back to sitting on the side of a flat bed without using bedrails?: A Little Help needed moving to and from a bed to a chair (including a wheelchair)?:  A Little Help needed standing up from a chair using your arms (e.g., wheelchair or bedside chair)?: A Little Help needed to walk in hospital room?: A Little Help needed climbing 3-5 steps with a railing? : A Little 6 Click Score: 18    End of Session Equipment Utilized During Treatment: Gait belt Activity Tolerance: Patient tolerated treatment well Patient left: in chair;with family/visitor present Nurse Communication: Mobility status PT Visit Diagnosis: Other abnormalities of gait and mobility (R26.89);Difficulty in walking, not elsewhere classified (R26.2)    Time: 1610-9604 PT Time Calculation (min) (ACUTE ONLY): 11 min   Charges:   PT Evaluation $PT Eval Low Complexity: 1 Low   PT General Charges $$ ACUTE PT VISIT: 1 Visit         Markcus Lazenby, PT, GCS 08/14/23,10:42 AM

## 2023-08-14 NOTE — ED Notes (Signed)
Pt given water at this time, denies other needs

## 2023-08-14 NOTE — Discharge Instructions (Signed)
Your physical therapy referral was sent to:   [facility]  Address: [facility], [address] [place], [place] [address] Open ? Closes 6:30?PM Phone: [phone]

## 2023-08-14 NOTE — Discharge Summary (Signed)
[facility] Discharge Summary   Patient: Gordon Garcia:[id number]  PCP: Marina Goodell, MD  Date of admission: 08/13/2023   Date of discharge: 08/14/2023     Discharge Diagnoses:  Principal Problem:   CVA (cerebral vascular accident) [facility]) Active Problems:   TIA (transient ischemic attack)   Gastroesophageal reflux disease without esophagitis   Hypertension   Hypertriglyceridemia   Atrial fibrillation (HCC)   Leg cramps   Admitted From: Home Disposition: Home  Recommendations for Outpatient Follow-up:  Follow-up with PCP in 1 week Follow-up with neurology, family requested ambulatory referral to [facility] neurology. Follow with the EP cardiology, family requested follow-up with [facility] cardiology for A-fib. Monitor BP at home, continue hydralazine as needed and follow with PCP to titrate medication accordingly. Check B12, iron profile and vitamin D level due to leg cramps.  Started Requip for possible RLS and Robaxin as needed for muscle spasms.  Follow-up with PCP for further management. Follow up LABS/TEST:  As above   Follow-up Information     Feldpausch, Madaline Guthrie, MD Follow up in 1 week(s).   Specialty: Family Medicine Contact information: [address] [place] [address] [phone]                Diet recommendation: Cardiac diet  Activity: The patient is advised to gradually reintroduce usual activities, as tolerated  Discharge Condition: stable  Code Status: Full code   History of present illness: As per the H and P dictated on admission Hospital Course:  Gordon Garcia is a 68 y.o. male with medical history significant of atrial fibrillation, hypertension, headache presenting with TIA symptoms.  Patient noted have been recently admitted January 20 5 January 26 for strokelike symptoms.  Had MRI of the brain with no acute findings however did show some chronic left-sided changes.  Was started on Eliquis as well as high dose Lipitor.  Had expressive  aphasia as well as right-sided weakness at that time.  Presented this morning around 6:30 AM with noted right-sided weakness.  Last known normal around 5 AM.  No reported fevers or chills.  No reported nausea or vomiting.  Family has been compliant with home regimen including Eliquis, statin.  No reports of falls.  Mild confusion as well as impaired gait.  Also with leg cramps have been a chronic issue.  At baseline per report. Presented to the ER afebrile, blood pressure 140s to 170s over 80s to 100s.  Satting well on room air.  White count 5.5, hemoglobin 13.9, platelets 252, urinalysis not indicative of infection, UDS positive for barbiturates, COVID flu and RSV negative.  Troponin within normal limits.  Creatinine 1.3 with GFR greater than 60.  CT head within normal limits.  Chest x-ray stable.  MRI of the brain with and without contrast pending.  Assessment and Plan:  # Acute bilateral basal ganglia infarcts, as per MRI brain below Recurrent R sided weakness with noted recent admission 1/25-1/26 for TIA evaluation.  Of note 1/25 MRI w/ Remote left caudate infarct and chronic microvascular ischemic disease 08/13/23 MRI brain:Acute small-vessel infarcts at the bilateral basal ganglia   Tele neuro consulted with recommendation for MRI brain wwo contrast, EEG negative. continue home regimen including eliquis and lipitor  PT/OT evaluation done, recommended outpatient PT and OT and outpatient speech therapy. 08/11/23 TTE LVEF 60 to 65%, grade 1 diastolic dysfunction. Patient was cleared by neurology to discharge and follow-up as an outpatient.  # Leg cramps, possible RLS versus muscle  spasms Noted recurrent leg cramps which are a longstanding issue per family  S/p flexeril given during hospital stay but patient did not feel any improvement.  Discharged on Requip 0.5 mg nightly as needed Robaxin as needed for spasm. #Paroxysmal atrial fibrillation: NSR on EKG, Rate controlled at present Cont eliquis #  Hypertriglyceridemia: Cont lipitor # Hypertension: Allow permissive hypertension for 48 hours and then start hydralazine 50 mg p.o. 3 times daily prn is SBP >140--150 mmHg Monitor BP at home and follow with PCP to titrate medication accordingly #GERD without esophagitis: PPI    Patient was seen by physical therapy, who recommended outpatient therapy, which was arranged. On the day of the discharge the patient's vitals were stable, and no other acute medical condition were reported by patient. the patient was felt safe to be discharge at Home outpatient therapy.  Consultants: Neurology Procedures: None  Discharge Exam: General: Appear in no distress, no Rash; Oral Mucosa Clear, moist. Cardiovascular: S1 and S2 Present, no Murmur, Respiratory: normal respiratory effort, Bilateral Air entry present and no Crackles, no wheezes Abdomen: Bowel Sound present, Soft and no tenderness, no hernia Extremities: no Pedal edema, no calf tenderness Neurology: AAOx3, difficulty speech, dysarthria and mild right-sided weakness power 4/5.affect appropriate.  There were no vitals filed for this visit. Vitals:   08/14/23 1100 08/14/23 1300  BP: (!) 142/93   Pulse: 69 66  Resp: 14 17  Temp:    SpO2: 100% 96%    DISCHARGE MEDICATION: Allergies as of 08/14/2023       Reactions   Codeine    Other reaction(s): Unknown        Medication List     STOP taking these medications    cyanocobalamin 1000 MCG tablet Commonly known as: VITAMIN B12   famotidine 20 MG/2ML Soln   ibuprofen 200 MG tablet Commonly known as: ADVIL   tiZANidine 4 MG tablet Commonly known as: ZANAFLEX   triamcinolone 55 MCG/ACT Aero nasal inhaler Commonly known as: NASACORT   Vitamin D 50 MCG (2000 UT) Caps       TAKE these medications    acetaminophen 325 MG tablet Commonly known as: TYLENOL Take 650 mg by mouth every 6 (six) hours as needed.   apixaban 5 MG Tabs tablet Commonly known as: ELIQUIS Take 1  tablet (5 mg total) by mouth 2 (two) times daily.   atorvastatin 80 MG tablet Commonly known as: LIPITOR Take 1 tablet (80 mg total) by mouth daily.   butalbital-acetaminophen-caffeine 50-325-40 MG tablet Commonly known as: FIORICET Take 1 tablet by mouth every 4 (four) hours as needed for headache. maximum 2 tablets per day   hydrALAZINE 50 MG tablet Commonly known as: APRESOLINE Take 1 tablet (50 mg total) by mouth 3 (three) times daily as needed (If systolic BP greater than 150 mmHg.). If systolic BP greater than 150 mmHg. Start taking on: August 15, 2023   methocarbamol 500 MG tablet Commonly known as: ROBAXIN Take 1 tablet (500 mg total) by mouth every 8 (eight) hours as needed for muscle spasms.   rOPINIRole 0.5 MG tablet Commonly known as: REQUIP Take 1 tablet (0.5 mg total) by mouth at bedtime as needed. For restless leg syndrome   tadalafil 20 MG tablet Commonly known as: CIALIS 1 tablet by mouth 1 hour prior to intercourse       Allergies  Allergen Reactions   Codeine     Other reaction(s): Unknown   Discharge Instructions     Ambulatory referral to  Cardiac Electrophysiology   Complete by: As directed    Ambulatory referral to Neurology   Complete by: As directed    Follow-up with [facility] neurology.  Referral requested by patient's daughter.   Call MD for:   Complete by: As directed    Weakness or numbness, any new neurological changes.   Call MD for:  difficulty breathing, headache or visual disturbances   Complete by: As directed    Call MD for:  extreme fatigue   Complete by: As directed    Call MD for:  persistant dizziness or light-headedness   Complete by: As directed    Call MD for:  persistant nausea and vomiting   Complete by: As directed    Call MD for:  severe uncontrolled pain   Complete by: As directed    Call MD for:  temperature >100.4   Complete by: As directed    Diet - low sodium heart healthy   Complete by: As directed    Discharge  instructions   Complete by: As directed    Follow-up with PCP in 1 week Follow-up with neurology, family requested ambulatory referral to [facility] neurology. Follow with the EP cardiology, family requested follow-up with [facility] cardiology for A-fib. Monitor BP at home, continue hydralazine as needed and follow with PCP to titrate medication accordingly. Check B12, iron profile and vitamin D level due to leg cramps.  Started Requip for possible RLS and Robaxin as needed for muscle spasms.  Follow-up with PCP for further management.   Increase activity slowly   Complete by: As directed        The results of significant diagnostics from this hospitalization (including imaging, microbiology, ancillary and laboratory) are listed below for reference.    Significant Diagnostic Studies: MR Brain W and Wo Contrast Result Date: 08/13/2023 CLINICAL DATA:  Mental status change with unknown cause. EXAM: MRI HEAD WITHOUT AND WITH CONTRAST TECHNIQUE: Multiplanar, multiecho pulse sequences of the brain and surrounding structures were obtained without and with intravenous contrast. CONTRAST:  7mL GADAVIST GADOBUTROL 1 MMOL/ML IV SOLN COMPARISON:  Three days ago FINDINGS: Brain: Small foci of restricted diffusion at the right upper putamen and left striatum. 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 Cluster of T2 hyperintense cystic spaces deep to the right cingulate gyrus, best attributed to dilated perivascular spaces. Mild chronic small vessel ischemic type change in the cerebral white matter and pons. No abnormal enhancement. No hemorrhage, hydrocephalus, or collection. Vascular: Normal flow voids and vascular enhancements. Skull and upper cervical spine: Normal marrow signal Sinuses/Orbits: Negative IMPRESSION: Acute small-vessel infarcts at the bilateral basal ganglia. Electronically Signed   By: Tiburcio Pea M.D.   On: 08/13/2023 09:07   DG Chest 2 View Result Date: 08/13/2023 CLINICAL  DATA:  68 year old male with history of confusion. EXAM: CHEST - 2 VIEW COMPARISON:  Chest x-ray 11/16/2021. FINDINGS: Lung volumes are normal. No consolidative airspace disease. No pleural effusions. No pneumothorax. No pulmonary nodule or mass noted. Pulmonary vasculature and the cardiomediastinal silhouette are within normal limits. IMPRESSION: No radiographic evidence of acute cardiopulmonary disease. Electronically Signed   By: Trudie Reed M.D.   On: 08/13/2023 07:54   CT HEAD CODE STROKE WO CONTRAST Result Date: 08/13/2023 CLINICAL DATA:  Code stroke.  Aphasia.  Right-sided weakness. EXAM: CT HEAD WITHOUT CONTRAST TECHNIQUE: Contiguous axial images were obtained from the base of the skull through the vertex without intravenous contrast. RADIATION DOSE REDUCTION: This exam was performed according to the departmental  dose-optimization program which includes automated exposure control, adjustment of the mA and/or kV according to patient size and/or use of iterative reconstruction technique. COMPARISON:  Brain MRI from 3 days ago FINDINGS: Brain: Band of low-density crossing the right basal ganglia and internal capsule, stable from prior head CT and interval brain MRI favoring dilated perivascular spaces. Chronic perforator infarct on the left in the same distribution. Subcortical low-density beneath the right cingulate gyrus is from dilated perivascular spaces by prior brain MRI. No acute hemorrhage, hydrocephalus, mass, or collection. Vascular: No hyperdense vessel or unexpected calcification. Skull: Normal. Negative for fracture or focal lesion. Sinuses/Orbits: No acute finding. Other: Prelim sent in epic chat. ASPECTS [facility] Stroke Program Early CT Score) - Ganglionic level infarction (caudate, lentiform nuclei, internal capsule, insula, M1-M3 cortex): 7 - Supraganglionic infarction (M4-M6 cortex): 3 Total score (0-10 with 10 being normal): 10 IMPRESSION: 1. Stable from head CT 3 days prior.  No  acute finding. 2. ASPECTS is 10 Electronically Signed   By: Tiburcio Pea M.D.   On: 08/13/2023 07:11   ECHOCARDIOGRAM COMPLETE Result Date: 08/11/2023    ECHOCARDIOGRAM REPORT   Patient Name:   Gordon Garcia Date of Exam: 08/11/2023 Medical Rec #:  [id number]  Height:       69.0 in Accession #:    [id number] Weight:       168.2 lb Date of Birth:  1956/02/13 BSA:          1.919 m Patient Age:    13 years   BP:           165/99 mmHg Patient Gender: M          HR:           70 bpm. Exam Location:  [facility] Procedure: 2D Echo, Cardiac Doppler and Color Doppler Indications:     Stroke I63.9  History:         Patient has no prior history of Echocardiogram examinations.                  Stroke and TIA; Risk Factors:Hypertension.  Sonographer:     Lucendia Herrlich RCS Referring Phys:  [id number] Sunnie Nielsen Diagnosing Phys: Chilton Si MD IMPRESSIONS  1. Left ventricular ejection fraction, by estimation, is 60 to 65%. The left ventricle has normal function. The left ventricle has no regional wall motion abnormalities. Left ventricular diastolic parameters are consistent with Grade I diastolic dysfunction (impaired relaxation).  2. Right ventricular systolic function is normal. The right ventricular size is normal. There is normal pulmonary artery systolic pressure.  3. The mitral valve is normal in structure. Trivial mitral valve regurgitation. No evidence of mitral stenosis.  4. The aortic valve is tricuspid. Aortic valve regurgitation is not visualized. No aortic stenosis is present.  5. The inferior vena cava is normal in size with greater than 50% respiratory variability, suggesting right atrial pressure of 3 mmHg. FINDINGS  Left Ventricle: Left ventricular ejection fraction, by estimation, is 60 to 65%. The left ventricle has normal function. The left ventricle has no regional wall motion abnormalities. The left ventricular internal cavity size was normal in size. There is  no left ventricular hypertrophy. Left  ventricular diastolic parameters are consistent with Grade I diastolic dysfunction (impaired relaxation). Normal left ventricular filling pressure. Right Ventricle: The right ventricular size is normal. No increase in right ventricular wall thickness. Right ventricular systolic function is normal. There is normal pulmonary artery systolic pressure. The tricuspid regurgitant velocity is 1.86 m/s,  and  with an assumed right atrial pressure of 3 mmHg, the estimated right ventricular systolic pressure is 16.8 mmHg. Left Atrium: Left atrial size was normal in size. Right Atrium: Right atrial size was normal in size. Pericardium: There is no evidence of pericardial effusion. Mitral Valve: The mitral valve is normal in structure. Trivial mitral valve regurgitation. No evidence of mitral valve stenosis. Tricuspid Valve: The tricuspid valve is normal in structure. Tricuspid valve regurgitation is trivial. No evidence of tricuspid stenosis. Aortic Valve: The aortic valve is tricuspid. Aortic valve regurgitation is not visualized. No aortic stenosis is present. Aortic valve peak gradient measures 10.0 mmHg. Pulmonic Valve: The pulmonic valve was normal in structure. Pulmonic valve regurgitation is not visualized. No evidence of pulmonic stenosis. Aorta: The aortic root is normal in size and structure. Venous: The inferior vena cava is normal in size with greater than 50% respiratory variability, suggesting right atrial pressure of 3 mmHg. IAS/Shunts: No atrial level shunt detected by color flow Doppler.  LEFT VENTRICLE PLAX 2D LVIDd:         4.20 cm   Diastology LVIDs:         2.40 cm   LV e' medial:    8.27 cm/s LV PW:         1.00 cm   LV E/e' medial:  8.9 LV IVS:        0.90 cm   LV e' lateral:   10.30 cm/s LVOT diam:     2.00 cm   LV E/e' lateral: 7.1 LV SV:         68 LV SV Index:   36 LVOT Area:     3.14 cm  RIGHT VENTRICLE             IVC RV S prime:     13.50 cm/s  IVC diam: 1.10 cm TAPSE (M-mode): 2.7 cm LEFT ATRIUM              Index        RIGHT ATRIUM           Index LA diam:        3.90 cm 2.03 cm/m   RA Area:     18.35 cm LA Vol (A2C):   45.6 ml 23.76 ml/m  RA Volume:   48.65 ml  25.35 ml/m LA Vol (A4C):   42.3 ml 22.04 ml/m LA Biplane Vol: 45.0 ml 23.44 ml/m  AORTIC VALVE AV Area (Vmax): 2.25 cm AV Vmax:        158.00 cm/s AV Peak Grad:   10.0 mmHg LVOT Vmax:      113.00 cm/s LVOT Vmean:     68.300 cm/s LVOT VTI:       0.218 m  AORTA Ao Root diam: 3.40 cm Ao Asc diam:  3.20 cm MITRAL VALVE               TRICUSPID VALVE MV Area (PHT): 3.03 cm    TR Peak grad:   13.8 mmHg MV Decel Time: 250 msec    TR Vmax:        186.00 cm/s MV E velocity: 73.40 cm/s MV A velocity: 89.60 cm/s  SHUNTS MV E/A ratio:  0.82        Systemic VTI:  0.22 m                            Systemic Diam: 2.00 cm Chilton Si MD Electronically signed  by Chilton Si MD Signature Date/Time: 08/11/2023/12:37:47 PM    Final    MR BRAIN WO CONTRAST Result Date: 08/10/2023 CLINICAL DATA:  Neuro deficit, acute, stroke suspected EXAM: MRI HEAD WITHOUT CONTRAST TECHNIQUE: Multiplanar, multiecho pulse sequences of the brain and surrounding structures were obtained without intravenous contrast. COMPARISON:  Same day CT head. FINDINGS: Brain: Remote lacunar infarct in the left caudate. Prominent suspected dilated perivascular spaces in the right frontal lobe. Additional scattered T2/FLAIR hyperintensity in the white matter compatible with chronic microvascular ischemic disease. No evidence of acute infarct, acute hemorrhage, mass lesion, midline shift or hydrocephalus. Vascular: Major arterial flow voids are maintained at the skull base. Skull and upper cervical spine: Normal marrow signal. Sinuses/Orbits: Mild paranasal sinus mucosal thickening. No acute orbital findings. Other: No mastoid effusions. IMPRESSION: 1. No evidence of acute intracranial abnormality. 2. Remote left caudate infarct and chronic microvascular ischemic disease. Electronically  Signed   By: Feliberto Harts M.D.   On: 08/10/2023 17:34   CT ANGIO HEAD NECK W WO CM W PERF (CODE STROKE) Result Date: 08/10/2023 CLINICAL DATA:  Neuro deficit, acute, stroke suspected. EXAM: CT ANGIOGRAPHY HEAD AND NECK CT PERFUSION BRAIN TECHNIQUE: Multidetector CT imaging of the head and neck was performed using the standard protocol during bolus administration of intravenous contrast. Multiplanar CT image reconstructions and MIPs were obtained to evaluate the vascular anatomy. Carotid stenosis measurements (when applicable) are obtained utilizing NASCET criteria, using the distal internal carotid diameter as the denominator. Multiphase CT imaging of the brain was performed following IV bolus contrast injection. Subsequent parametric perfusion maps were calculated using RAPID software. RADIATION DOSE REDUCTION: This exam was performed according to the departmental dose-optimization program which includes automated exposure control, adjustment of the mA and/or kV according to patient size and/or use of iterative reconstruction technique. CONTRAST:  OMNIPAQUE IOHEXOL 350 MG/ML SOLN COMPARISON:  CT head without contrast 08/10/2023. FINDINGS: CTA NECK FINDINGS Aortic arch: A 3 vessel arch configuration is present. No significant atherosclerotic changes are present. Great vessel origins are within normal limits. Right carotid system: The right common carotid artery is within normal limits bifurcation is unremarkable. The cervical right ICA is normal. Left carotid system: The left common carotid artery is within normal limits. Bifurcation is unremarkable. Cervical left ICA is within normal limits. Vertebral arteries: The vertebral arteries are codominant. Both vertebral arteries originate from the subclavian arteries without significant stenosis. No significant stenosis is present in either vertebral artery in the neck. Skeleton: Uncovertebral and foraminal narrowing is greatest at C3-4 and C4-5 on the  right. Other neck: Soft tissues the neck are otherwise unremarkable. Salivary glands are within normal limits. A 3.2 cm nodule is present in the inferior left lobe of the thyroid. No significant adenopathy is present. Upper chest: The lung apices are clear. The thoracic inlet is within normal limits. Review of the MIP images confirms the above findings CTA HEAD FINDINGS Anterior circulation: Minimal atherosclerotic calcifications are present within the cavernous internal carotid arteries bilaterally. Significant stenosis is present through the ICA termini. The A1 and M1 segments are normal. The anterior communicating artery is patent. MCA bifurcations are normal bilaterally. The ACA and MCA branch vessels are normal bilaterally. No aneurysm is present. Posterior circulation: The PICA origins are visualized and normal. Vertebrobasilar junction basilar artery normal. The superior cerebral arteries originate from the scratched at the superior cerebellar arteries are patent. Both posterior cerebral arteries originate from basilar tip. The PCA branch vessels are within normal limits bilaterally. No  aneurysms are present. Venous sinuses: The dural sinuses are patent. The straight sinus and deep cerebral veins are intact. Cortical veins are within normal limits. No significant vascular malformation is evident. Anatomic variants: None Review of the MIP images confirms the above findings CT Brain Perfusion Findings: ASPECTS: 10/10 CBF (<30%) Volume: 0mL Perfusion (Tmax>6.0s) volume: 0mL Mismatch Volume: 0mL IMPRESSION: 1. Normal CTA of the neck. No significant stenosis, dissection, or aneurysm is present. 2. Minimal atherosclerotic calcifications within the cavernous internal carotid arteries bilaterally without significant stenosis. 3. Normal CT perfusion. 4. 3.2 cm nodule in the inferior left lobe of the thyroid. Recommend non-emergent thyroid ultrasound. Reference: J Am Coll Radiol. 2015 Feb;12(2): 143-50 Electronically  Signed   By: Marin Roberts M.D.   On: 08/10/2023 15:43   CT HEAD CODE STROKE WO CONTRAST` Result Date: 08/10/2023 CLINICAL DATA:  Code stroke. Neuro deficit, acute, stroke suspected. Hypertension. Progressive headache through the day. EXAM: CT HEAD WITHOUT CONTRAST TECHNIQUE: Contiguous axial images were obtained from the base of the skull through the vertex without intravenous contrast. RADIATION DOSE REDUCTION: This exam was performed according to the departmental dose-optimization program which includes automated exposure control, adjustment of the mA and/or kV according to patient size and/or use of iterative reconstruction technique. COMPARISON:  CT head without contrast 08/10/2023. FINDINGS: Brain: Remote lacunar infarcts involving the left caudate head and medial right frontal lobe are stable. No acute hemorrhage or mass lesion is present. No significant oval change is present. The ventricles are of normal size. No significant extraaxial fluid collection is present. The brainstem and cerebellum are within normal limits. Midline structures are within normal limits. Vascular: No hyperdense vessel or unexpected calcification. Skull: Calvarium is intact. No focal lytic or blastic lesions are present. No significant extracranial soft tissue lesion is present. Sinuses/Orbits: The paranasal sinuses and mastoid air cells are clear. The globes and orbits are within normal limits. ASPECTS [facility] Stroke Program Early CT Score) - Ganglionic level infarction (caudate, lentiform nuclei, internal capsule, insula, M1-M3 cortex): 7/7 - Supraganglionic infarction (M4-M6 cortex): 3/3 Total score (0-10 with 10 being normal): 10/10 IMPRESSION: 1. No acute intracranial abnormality or significant interval change. 2. Aspects is 10/10. 3. Stable remote lacunar infarcts of the left caudate head and medial right frontal lobe. The above was relayed via text pager to Dr. Wilford Corner on 08/10/2023 at 14:56 . Electronically Signed    By: Marin Roberts M.D.   On: 08/10/2023 14:57   CT Head Wo Contrast Result Date: 08/10/2023 CLINICAL DATA:  Headache, intracranial hypertension features EXAM: CT HEAD WITHOUT CONTRAST TECHNIQUE: Contiguous axial images were obtained from the base of the skull through the vertex without intravenous contrast. RADIATION DOSE REDUCTION: This exam was performed according to the departmental dose-optimization program which includes automated exposure control, adjustment of the mA and/or kV according to patient size and/or use of iterative reconstruction technique. COMPARISON:  None Available. FINDINGS: Brain: No evidence of acute large vascular territory infarction, hemorrhage, hydrocephalus, extra-axial collection or mass lesion/mass effect. Remote left caudate lacunar infarct. Probable additional lacunar infarct in the subcortical right frontal lobe. Vascular: No hyperdense vessel. Skull: No acute fracture. Sinuses/Orbits: Paranasal sinus mucosal thickening. No acute orbital findings. Other: No mastoid effusions. IMPRESSION: 1. No evidence of acute intracranial abnormality. 2. Remote appearing left caudate and right frontal lacunar infarcts. An MRI could better evaluate for acute infarct if clinically warranted. Electronically Signed   By: Feliberto Harts M.D.   On: 08/10/2023 14:34    Microbiology: Recent Results (from  the past 240 hours)  Resp panel by RT-PCR (RSV, Flu A&B, Covid) Anterior Nasal Swab     Status: None   Collection Time: 08/13/23  9:45 AM   Specimen: Anterior Nasal Swab  Result Value Ref Range Status   SARS Coronavirus 2 by RT PCR NEGATIVE NEGATIVE Final    Comment: (NOTE) SARS-CoV-2 target nucleic acids are NOT DETECTED.  The SARS-CoV-2 RNA is generally detectable in upper respiratory specimens during the acute phase of infection. The lowest concentration of SARS-CoV-2 viral copies this assay can detect is 138 copies/mL. A negative result does not preclude  SARS-Cov-2 infection and should not be used as the sole basis for treatment or other patient management decisions. A negative result may occur with  improper specimen collection/handling, submission of specimen other than nasopharyngeal swab, presence of viral mutation(s) within the areas targeted by this assay, and inadequate number of viral copies(<138 copies/mL). A negative result must be combined with clinical observations, patient history, and epidemiological information. The expected result is Negative.  Fact Sheet for Patients:  BloggerCourse.com  Fact Sheet for Healthcare Providers:  SeriousBroker.it  This test is no t yet approved or cleared by the [place] FDA and  has been authorized for detection and/or diagnosis of SARS-CoV-2 by FDA under an Emergency Use Authorization (EUA). This EUA will remain  in effect (meaning this test can be used) for the duration of the COVID-19 declaration under Section 564(b)(1) of the Act, 21 U.S.C.section 360bbb-3(b)(1), unless the authorization is terminated  or revoked sooner.       Influenza A by PCR NEGATIVE NEGATIVE Final   Influenza B by PCR NEGATIVE NEGATIVE Final    Comment: (NOTE) The Xpert Xpress SARS-CoV-2/FLU/RSV plus assay is intended as an aid in the diagnosis of influenza from Nasopharyngeal swab specimens and should not be used as a sole basis for treatment. Nasal washings and aspirates are unacceptable for Xpert Xpress SARS-CoV-2/FLU/RSV testing.  Fact Sheet for Patients: BloggerCourse.com  Fact Sheet for Healthcare Providers: SeriousBroker.it  This test is not yet approved or cleared by the [place] FDA and has been authorized for detection and/or diagnosis of SARS-CoV-2 by FDA under an Emergency Use Authorization (EUA). This EUA will remain in effect (meaning this test can be used) for the duration of  the COVID-19 declaration under Section 564(b)(1) of the Act, 21 U.S.C. section 360bbb-3(b)(1), unless the authorization is terminated or revoked.     Resp Syncytial Virus by PCR NEGATIVE NEGATIVE Final    Comment: (NOTE) Fact Sheet for Patients: BloggerCourse.com  Fact Sheet for Healthcare Providers: SeriousBroker.it  This test is not yet approved or cleared by the [place] FDA and has been authorized for detection and/or diagnosis of SARS-CoV-2 by FDA under an Emergency Use Authorization (EUA). This EUA will remain in effect (meaning this test can be used) for the duration of the COVID-19 declaration under Section 564(b)(1) of the Act, 21 U.S.C. section 360bbb-3(b)(1), unless the authorization is terminated or revoked.  Performed at [facility], [address] [place], [place] [address]      Labs: CBC: Recent Labs  Lab 08/10/23 1305 08/13/23 0644 08/14/23 0451  WBC 5.1 5.5 5.8  NEUTROABS 2.3 1.9  --   HGB 14.2 13.9 14.2  HCT 41.8 41.3 41.8  MCV 92.3 94.3 92.1  PLT 264 252 270   Basic Metabolic Panel: Recent Labs  Lab 08/10/23 1305 08/13/23 0644 08/13/23 0945 08/14/23 0451  NA 141 137  --  139  K 4.6  5.0  --  4.0  CL 104 105  --  105  CO2 25 24  --  22  GLUCOSE 106* 109*  --  106*  BUN 24* 22  --  27*  CREATININE 1.29* 1.30*  --  1.07  CALCIUM 9.4 8.9  --  9.4  MG  --   --  2.0  --    Liver Function Tests: Recent Labs  Lab 08/10/23 1305 08/13/23 0644 08/14/23 0451  AST 21 30 21   ALT 21 13 20   ALKPHOS 56 49 53  BILITOT 0.9 1.2 0.7  PROT 7.1 6.6 6.8  ALBUMIN 4.4 3.9 3.9   No results for input(s): "LIPASE", "AMYLASE" in the last 168 hours. Recent Labs  Lab 08/13/23 0945  AMMONIA 19   Cardiac Enzymes: No results for input(s): "CKTOTAL", "CKMB", "CKMBINDEX", "TROPONINI" in the last 168 hours. BNP (last 3 results) No results for input(s): "BNP" in the last 8760  hours. CBG: Recent Labs  Lab 08/10/23 1437 08/13/23 0637  GLUCAP 94 97    Time spent: 35 minutes  Signed:  Gillis Santa  [facility]  08/14/2023 1:35 PM

## 2023-08-14 NOTE — TOC CM/SW Note (Signed)
TOC completed and sent referral for OPT PT to [facility] at [facility].

## 2023-08-14 NOTE — Evaluation (Signed)
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    PMH significant for recentTIA, Allergic rhinitis, Atrial flutter (HCC), GERD (gastroesophageal reflux disease), and HTN (hypertension).   Clinical Impression   Chart reviewed, pt greeted in room with wife present. Pt is alert and oriented x4, increased time for processing which wife reports is not baseline throughout. PTA pt is indep in ADL/IADL, works, drives, amb with no AD. Pt presents with deficits in RUE function, activity tolerance, balance, cognition, affecting safe and optimal ADL completion. STS completed with supervision, amb in hallway >200' with no AD with CGA with pt with R lateral lean at time during amb requiring verbal cueing to correct. Provided education re: RUE HEP, dispo recommendations. Pt will benefit from acute OT to address deficits and to facilitate opimtal ADL performance. Pt is left in room, all needs met. OT will continue to follow.       If plan is discharge home, recommend the following: A little help with bathing/dressing/bathroom;Help with stairs or ramp for entrance;Direct supervision/assist for medications management;Assistance with cooking/housework    Functional Status Assessment  Patient has had a recent decline in their functional status and demonstrates the ability to make significant improvements in function in a reasonable and predictable amount of time.  Equipment Recommendations  None recommended by OT    Recommendations for Other Services       Precautions / Restrictions Precautions Precautions: Fall Restrictions Weight Bearing Restrictions Per Provider Order: No      Mobility Bed Mobility Overal bed mobility: Needs Assistance              General bed mobility comments: NT in recliner pre/post session    Transfers Overall transfer level: Needs assistance Equipment used: None Transfers: Sit to/from Stand Sit to Stand: Supervision                  Balance Overall balance assessment: Needs assistance Sitting-balance support: Feet supported Sitting balance-Leahy Scale: Good   Postural control: Right lateral lean Standing balance support: No upper extremity supported, During functional activity Standing balance-Leahy Scale: Fair Standing balance comment: R lateral lean while amb                           ADL either performed or assessed with clinical judgement   ADL Overall ADL's : Needs assistance/impaired     Grooming: Supervision/safety;Sitting           Upper Body Dressing : Supervision/safety Upper Body Dressing Details (indicate cue type and reason): anticipate Lower Body Dressing: Minimal assistance Lower Body Dressing Details (indicate cue type and reason): anticipate Toilet Transfer: Contact guard assist;Ambulation Toilet Transfer Details (indicate cue type and reason): simulated with no AD, intermittent vcs for technique Toileting- Clothing Manipulation and Hygiene: Supervision/safety;Sit to/from stand Toileting - Clothing Manipulation Details (indicate cue type and reason): anticipate     Functional mobility during ADLs: Contact guard assist;Minimal assistance (over 200' with no AD, intermittent vcs for technique (Pt laterally leaning to the R when amb))       Vision Patient Visual Report: No change from baseline Vision Assessment?: Yes Ocular Range of Motion: Within Functional Limits Additional Comments: appears WFL, wears glasses, will continue to assess  Perception         Praxis         Pertinent Vitals/Pain Pain Assessment Pain Assessment: No/denies pain     Extremity/Trunk Assessment Upper Extremity Assessment Upper Extremity Assessment:  Right hand dominant;RUE deficits/detail (LUE grossly WFL) RUE Deficits / Details: AROM WFL; MMT 4+/5 shoulder flexion, 4/5 elbow flexion/extension, wrist flexion/extension 4/5, mildly weak grip strength, mildly impaired FMC/dexterity; educated pt and wife re: HEP for improved function RUE Sensation: WNL (kinesthesia/proprioception appear WFL will continue to assess) RUE Coordination: decreased fine motor   Lower Extremity Assessment Lower Extremity Assessment: Defer to PT evaluation   Cervical / Trunk Assessment Cervical / Trunk Assessment: Normal   Communication Communication Communication: Difficulty following commands/understanding Following commands: Follows one step commands with increased time Cueing Techniques: Verbal cues;Visual cues   Cognition Arousal: Alert Behavior During Therapy: Flat affect, WFL for tasks assessed/performed Overall Cognitive Status: Impaired/Different from baseline Area of Impairment: Problem solving, Following commands                       Following Commands: Follows one step commands with increased time     Problem Solving: Slow processing, Requires verbal cues       General Comments  vss throughout    Exercises Other Exercises Other Exercises: edu pt and wife re: role of OT, role of rehab, discharge recommendaitons, RUE HEP, safe ADL completion   Shoulder Instructions      Home Living Family/patient expects to be discharged to:: Private residence Living Arrangements: Spouse/significant other Available Help at Discharge: Family;Available PRN/intermittently (wife works full time) Type of Home: House Home Access: Stairs to enter Secretary/administrator of Steps: 4 Entrance Stairs-Rails: None Home Layout: One level     Bathroom Shower/Tub: Chief Strategy Officer: Standard Bathroom Accessibility: Yes   Home Equipment: None          Prior Functioning/Environment Prior Level of Function : Independent/Modified  Independent;Working/employed;Driving                        OT Problem List: Decreased cognition;Impaired balance (sitting and/or standing);Decreased strength;Decreased activity tolerance;Decreased knowledge of use of DME or AE;Decreased range of motion;Impaired UE functional use      OT Treatment/Interventions: DME and/or AE instruction;Self-care/ADL training;Therapeutic activities;Balance training;Therapeutic exercise;Cognitive remediation/compensation;Neuromuscular education;Patient/family education    OT Goals(Current goals can be found in the care plan section) Acute Rehab OT Goals Patient Stated Goal: return to PLOF OT Goal Formulation: With patient/family Time For Goal Achievement: 08/28/23 Potential to Achieve Goals: Good ADL Goals Pt Will Perform Grooming: with modified independence Pt Will Perform Lower Body Dressing: with modified independence;sit to/from stand Pt Will Transfer to Toilet: with modified independence;ambulating Pt Will Perform Toileting - Clothing Manipulation and hygiene: with modified independence;sit to/from stand Pt/caregiver will Perform Home Exercise Program: Right Upper extremity;With written HEP provided Additional ADL Goal #1: Pt will participate in further functional cognition tasks such as pill box assessment to optimize IADL of medication management  OT Frequency: Min 1X/week    Co-evaluation PT/OT/SLP Co-Evaluation/Treatment: Yes Reason for Co-Treatment: To address functional/ADL transfers          AM-PAC OT "6 Clicks" Daily Activity     Outcome Measure Help from another person eating meals?: None Help from another person taking care of personal grooming?: None Help from another person toileting, which includes using toliet, bedpan, or urinal?: None Help from another person bathing (including washing, rinsing, drying)?: A  Little Help from another person to put on and taking off regular upper body clothing?: None Help from another  person to put on and taking off regular lower body clothing?: None 6 Click Score: 23   End of Session Equipment Utilized During Treatment: Gait belt Nurse Communication: Mobility status  Activity Tolerance: Patient tolerated treatment well Patient left: in chair;with call bell/phone within reach  OT Visit Diagnosis: Other abnormalities of gait and mobility (R26.89)                Time: 1610-9604 OT Time Calculation (min): 22 min Charges:  OT General Charges $OT Visit: 1 Visit OT Evaluation $OT Eval Moderate Complexity: 1 Mod  Oleta Mouse, OTD OTR/L  08/14/23, 10:30 AM

## 2023-08-14 NOTE — ED Notes (Signed)
Patient discharged from hospital by provider. Discharge instructions reviewed with patient and family. All questions answered. Patient ambulatory from ED in NAD.

## 2023-08-14 NOTE — ED Notes (Signed)
Patient noted to be off monitor since approx 1200. This RN in room to find patient dressed and in recliner chair. Patient stated he was being discharged so he didn't need cardiac monitoring any more. Pt educated that he does not have discharge orders at this time, so monitoring will need to continue.  Patient placed back on monitor.

## 2023-08-19 ENCOUNTER — Emergency Department: Payer: 59

## 2023-08-19 ENCOUNTER — Other Ambulatory Visit: Payer: Self-pay

## 2023-08-19 ENCOUNTER — Emergency Department
Admission: EM | Admit: 2023-08-19 | Discharge: 2023-08-19 | Disposition: A | Discharge status: Home / Self Care | Payer: 59 | Attending: Emergency Medicine | Admitting: Emergency Medicine

## 2023-08-19 DIAGNOSIS — R519 Headache, unspecified: Secondary | ICD-10-CM | POA: Diagnosis present

## 2023-08-19 DIAGNOSIS — Z8673 Personal history of transient ischemic attack (TIA), and cerebral infarction without residual deficits: Secondary | ICD-10-CM | POA: Diagnosis not present

## 2023-08-19 DIAGNOSIS — Z7901 Long term (current) use of anticoagulants: Secondary | ICD-10-CM | POA: Insufficient documentation

## 2023-08-19 DIAGNOSIS — I10 Essential (primary) hypertension: Secondary | ICD-10-CM | POA: Insufficient documentation

## 2023-08-19 DIAGNOSIS — E041 Nontoxic single thyroid nodule: Secondary | ICD-10-CM

## 2023-08-19 LAB — URINE DRUG SCREEN, QUALITATIVE (ARMC ONLY)
Amphetamines, Ur Screen: NOT DETECTED
Barbiturates, Ur Screen: POSITIVE — AB
Benzodiazepine, Ur Scrn: NOT DETECTED
Cannabinoid 50 Ng, Ur ~~LOC~~: NOT DETECTED
Cocaine Metabolite,Ur ~~LOC~~: NOT DETECTED
MDMA (Ecstasy)Ur Screen: NOT DETECTED
Methadone Scn, Ur: NOT DETECTED
Opiate, Ur Screen: NOT DETECTED
Phencyclidine (PCP) Ur S: NOT DETECTED
Tricyclic, Ur Screen: NOT DETECTED

## 2023-08-19 LAB — C-REACTIVE PROTEIN: CRP: <0.5 mg/dL (ref <1.0)

## 2023-08-19 LAB — BASIC METABOLIC PANEL
Anion gap: 11 (ref 5–15)
BUN: 19 mg/dL (ref 8–23)
CO2: 27 mmol/L (ref 22–32)
Calcium: 9.7 mg/dL (ref 8.9–10.3)
Chloride: 103 mmol/L (ref 98–111)
Creatinine, Ser: 1.17 mg/dL (ref 0.61–1.24)
GFR, Estimated: >60 mL/min (ref >60)
Glucose, Bld: 130 mg/dL — ABNORMAL HIGH (ref 70–99)
Potassium: 4.1 mmol/L (ref 3.5–5.1)
Sodium: 141 mmol/L (ref 135–145)

## 2023-08-19 LAB — URINALYSIS, ROUTINE W REFLEX MICROSCOPIC
Bilirubin Urine: NEGATIVE
Glucose, UA: NEGATIVE mg/dL
Hgb urine dipstick: NEGATIVE
Ketones, ur: NEGATIVE mg/dL
Leukocytes,Ua: NEGATIVE
Nitrite: NEGATIVE
Protein, ur: NEGATIVE mg/dL
Specific Gravity, Urine: 1.013 (ref 1.005–1.030)
pH: 6 (ref 5.0–8.0)

## 2023-08-19 LAB — CBC
HCT: 42.7 % (ref 39.0–52.0)
Hemoglobin: 14.6 g/dL (ref 13.0–17.0)
MCH: 31.1 pg (ref 26.0–34.0)
MCHC: 34.2 g/dL (ref 30.0–36.0)
MCV: 90.9 fL (ref 80.0–100.0)
Platelets: 301 10*3/uL (ref 150–400)
RBC: 4.7 MIL/uL (ref 4.22–5.81)
RDW: 12.6 % (ref 11.5–15.5)
WBC: 7.3 10*3/uL (ref 4.0–10.5)
nRBC: 0 % (ref 0.0–0.2)

## 2023-08-19 LAB — HIV ANTIBODY (ROUTINE TESTING W REFLEX): HIV Screen 4th Generation wRfx: NONREACTIVE

## 2023-08-19 LAB — SEDIMENTATION RATE: Sed Rate: 4 mm/h (ref 0–20)

## 2023-08-19 LAB — VITAMIN B12: Vitamin B-12: 742 pg/mL (ref 180–914)

## 2023-08-19 LAB — TSH: TSH: 1.531 u[IU]/mL (ref 0.350–4.500)

## 2023-08-19 MED ORDER — ACETAMINOPHEN 500 MG PO TABS
1000.0000 mg | ORAL_TABLET | Freq: Once | ORAL | Status: AC
  Administered 2023-08-19: 1000 mg via ORAL
  Filled 2023-08-19: qty 2

## 2023-08-19 NOTE — Discharge Instructions (Addendum)
You are seen in the emergency department for headache.  You had a repeat MRI done that did not show a new stroke or blood in your brain.  Your evaluated by neurology who recommended outpatient follow-up.  You had labs obtained that had not yet resulted.  Your primary care physician can help follow-up with these lab work.  You are also given information to establish care with neurology as an outpatient with Dr. Sherryll Burger.   Return to the emergency department for any new or different headache or new neurologic deficit.  You had an incidental finding of a thyroid nodule, follow-up with your primary care physician so they can workup your thyroid nodule.  Thank you for choosing [place] for your health care, it was my pleasure to care for you today!  Corena Herter, MD

## 2023-08-19 NOTE — ED Provider Notes (Signed)
[facility] Provider Note    Event Date/Time   First MD Initiated Contact with Patient 08/19/23 [phone]     (approximate)   History   Headache and Hypertension   HPI  Gordon Garcia is a 68 y.o. male  CVA, TIA, a flutter on Eliquis, hypertension, GERD who comes in with elevated blood pressure, headache.  On review of records patient was discharged on 08/11/2023 for TIA workup had an MRI that was negative for stroke.  He then returned on 1/28 and had a stroke workup and which was then positive for stroke.  Patient is on Eliquis.  Patient had a headache yesterday and throughout the night current headache is a 3 out of 10 but noted to have elevated blood pressure.  Patient took his hydralazine at 430.  Patient states that the headache initially started around sometime yesterday afternoon.  He went to bed at 930 and had the headache but no other symptoms.  When he woke up at 330 he had a little bit of dizziness and some of his speech felt off similar to when he had had this stroke.  He reports that he is not sure if the symptoms were very prominent or his family was just overly concerned about him having the headache.  He states that he feels much better and not his typical self other than a very mild 1-2 headache.  Patient had an MRI done on 08/13/2023 that did show acute small vessel infarcts the bilateral basal ganglia.  He is also had CTA imaging done previously without any evidence of aneurysm.        Physical Exam   Triage Vital Signs: ED Triage Vitals  Encounter Vitals Group     BP 08/19/23 0510 (!) 164/89     Systolic BP Percentile --      Diastolic BP Percentile --      Pulse Rate 08/19/23 0510 84     Resp 08/19/23 0510 16     Temp 08/19/23 0510 98.6 F (37 C)     Temp Source 08/19/23 0510 Oral     SpO2 08/19/23 0510 100 %     Weight 08/19/23 0513 165 lb (74.8 kg)     Height 08/19/23 0513 5\' 8"  (1.727 m)     Head Circumference --      Peak Flow --       Pain Score 08/19/23 0513 3     Pain Loc --      Pain Education --      Exclude from Growth Chart --     Most recent vital signs: Vitals:   08/19/23 0510  BP: (!) 164/89  Pulse: 84  Resp: 16  Temp: 98.6 F (37 C)  SpO2: 100%     General: Awake, no distress.  CV:  Good peripheral perfusion.  Resp:  Normal effort.  Abd:  No distention.  Other:  Cranial nerves II through XII are intact.  Equal strength in arms and legs   ED Results / Procedures / Treatments   Labs (all labs ordered are listed, but only abnormal results are displayed) Labs Reviewed  BASIC METABOLIC PANEL - Abnormal; Notable for the following components:      Result Value   Glucose, Bld 130 (*)    All other components within normal limits  URINALYSIS, ROUTINE W REFLEX MICROSCOPIC - Abnormal; Notable for the following components:   Color, Urine YELLOW (*)    APPearance CLEAR (*)  All other components within normal limits  CBC  CBG MONITORING, ED     EKG  My interpretation of EKG:  Normal sinus rate of 86 that any ST elevation or T wave inversions, normal intervals  RADIOLOGY I have reviewed the CT head personally interpreted no evidence of intracranial hemorrhage PROCEDURES:  Critical Care performed: No  Procedures   MEDICATIONS ORDERED IN ED: Medications - No data to display   IMPRESSION / MDM / ASSESSMENT AND PLAN / ED COURSE  I reviewed the triage vital signs and the nursing notes.   Patient's presentation is most consistent with acute presentation with potential threat to life or bodily function.   Patient comes in with concerns for headache.  There was concern for some possibility of change in speech and some dizziness but he states that he is not sure if that is just his family overreacting he states that he feels much better at this time.  Stroke code was not called.  Patient is out of the window.  CT head  Acute basal ganglia infarcts without hemorrhage or progression  when  compared to recent brain MRI.    CBC reassuring BMP reassuring   We discussed with family and they are requesting a repeat MRI to rule out a new acute stroke.  Will get neurology to eval talk to the family given they have a lot of questions about his blood pressure.  The patient is on the cardiac monitor to evaluate for evidence of arrhythmia and/or significant heart rate changes.  Clinical Course as of 08/19/23 0727  Mon Aug 19, 2023  0714 Headache with dizziness and slurred speech, no resolved. Recent stroke on eliquis.  [SM]    Clinical Course User Index [SM] Corena Herter, MD     FINAL CLINICAL IMPRESSION(S) / ED DIAGNOSES   Final diagnoses:  Bad headache     Rx / DC Orders   ED Discharge Orders     None        Note:  This document was prepared using Dragon voice recognition software and may include unintentional dictation errors.   Concha Se, MD 08/19/23 [phone]

## 2023-08-19 NOTE — ED Notes (Signed)
Pt wife requesting MRI at this time. Stated to wife and son of pt that we would triage pt and put in triage protocols.

## 2023-08-19 NOTE — ED Notes (Signed)
Pt family member at the desk requesting for tylenol and water for the pt. MD made aware.

## 2023-08-19 NOTE — ED Notes (Signed)
 Pt taken to MRI at this time

## 2023-08-19 NOTE — ED Notes (Signed)
 Pt back from MRI

## 2023-08-19 NOTE — ED Notes (Signed)
Gave report to MRI at this time for pt.

## 2023-08-19 NOTE — ED Notes (Signed)
Spouse came in yelling demanding we do a MRI scan right now, advised the spouse the pt is being triaged and the doctor will make the determination whether to call a code stroke or not which will warrant a CT scan at that point. Spouse then demanded we make her son come out of triage so she can go back with her since we will not allow 2 people back. Attempted to explain to the pt spouse the current visitor restrictions, however she continued to yell at staff while being walked back to triage room by First nurse, Durene Cal.

## 2023-08-19 NOTE — ED Triage Notes (Signed)
Patient reports having a headache rated 8/10 yesterday and throughout the night. Patient states the headache is currently rated 3/10. States at home his blood pressure was 180/100. Patient reports at 430am his took hydralazine for his blood pressure. Patient states he has been taking his medication as normal. Reports he is dizzy when walking around. Patient was recently seen here, admitted and discharged for stroke and TIA. Patient denies changes in his speech, numbness, tingling, blurred vision.

## 2023-08-19 NOTE — ED Notes (Signed)
Copper serum added for blood work. This RN called lab at this time for that specific tube to be sent down. No dark blue tubes are carried in the ED.Lab tubing down now.

## 2023-08-19 NOTE — ED Notes (Addendum)
Wife reports that he has shaking and speaks slower. Symptoms not observed in triage. Patient ambulated to the bathroom for urine sample.

## 2023-08-19 NOTE — ED Notes (Signed)
Pt noted to have taken off all monitoring equipment and is sitting in a chair bedside of bed.

## 2023-08-19 NOTE — ED Provider Notes (Signed)
Care assumed of patient from outgoing provider.  See their note for initial history, exam and plan.  Clinical Course as of 08/19/23 0744  Mon Aug 19, 2023  0714 Headache with dizziness and slurred speech, no resolved. Recent stroke on eliquis.  [SM]    Clinical Course User Index [SM] Corena Herter, MD   Patient is back to baseline.  Recent CVA.  Consulted neurology.   Corena Herter, MD 08/19/23 [phone]

## 2023-08-19 NOTE — Consult Note (Addendum)
NEUROLOGY CONSULT NOTE   Date of service: August 19, 2023 Patient Name: Gordon Garcia MRN:  [id number] DOB:  October 27, 1955 Chief Complaint: "Headache, recent strokes" Requesting Provider: Corena Herter, MD  History of Present Illness  Gordon Garcia is a 68 y.o. 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  Reports minimal improvement with barbiturates, headache quality is throbbing in nature, intensity ranges from 3-8, at times hard to sleep due to headache but no positional component, not worsening on laying down nor improving on standing up.  Typically gets worse over the course of the day.  He initially presented 1/25 with severe headache, some confusion, did have transient right sided weakness at that time.  His MRI was negative, he was started on Eliquis and atorvastatin.  Echo demonstrated grade 1 diastolic dysfunction, CTA was normal He presented again on 1/28 with right-sided weakness and being off balance.  MRI then demonstrated bilateral basal ganglia strokes, favored to be secondary to small vessel disease but due to small size and greatest risk from atrial fibrillation he was continued on Eliquis.  EEG was also obtained for confusional symptoms which was negative  He reports he has taken 1 or 2 tablets of Fioricet.  Reports he is drinking only once or twice a week, no more than 2 drinks at a time.  No jaw claudication, no proximal muscle weakness, no temporal tenderness, no transient loss of vision  Daughter reports intermittent resting heart rate in the 50s  LKW:  Modified rankin score: 1-No significant post stroke disability and can perform usual duties with stroke symptoms IV Thrombolysis: No, Eliquis, recent stroke EVT: No exam not c/w LVO  NIHSS components Score: Comment  1a Level of  Conscious 0[x]  1[]  2[]  3[]      1b LOC Questions 0[x]  1[]  2[]       1c LOC Commands 0[x]  1[]  2[]       2 Best Gaze 0[x]  1[]  2[]       3 Visual 0[x]  1[]  2[]  3[]      4 Facial Palsy 0[]  1[x]  2[]  3[]      5a Motor Arm - left 0[x]  1[]  2[]  3[]  4[]  UN[]    5b Motor Arm - Right 0[x]  1[]  2[]  3[]  4[]  UN[]    6a Motor Leg - Left 0[x]  1[]  2[]  3[]  4[]  UN[]    6b Motor Leg - Right 0[x]  1[]  2[]  3[]  4[]  UN[]    7 Limb Ataxia 0[x]  1[]  2[]  3[]  UN[]     8 Sensory 0[x]  1[]  2[]  UN[]      9 Best Language 0[x]  1[]  2[]  3[]      10 Dysarthria 0[x]  1[]  2[]  UN[]      11 Extinct. and Inattention 0[x]  1[]  2[]       TOTAL:       ROS  Comprehensive ROS performed and pertinent positives documented in HPI   Past History   Past Medical History:  Diagnosis Date   Allergic rhinitis    Atrial flutter (HCC)    GERD (gastroesophageal reflux disease)    HTN (hypertension)     Past Surgical History:  Procedure Laterality Date   COLONOSCOPY WITH PROPOFOL N/A 02/14/2021   Procedure: COLONOSCOPY WITH PROPOFOL;  Surgeon: Midge Minium, MD;  Location: [facility] ENDOSCOPY;  Service: Endoscopy;  Laterality: N/A;    Family History: History reviewed. No pertinent family history.  Social History  reports that he has been smoking cigars. He has never used smokeless tobacco. He reports current alcohol use. He reports that he does not use drugs.  Allergies  Allergen Reactions   Codeine     Other reaction(s): Unknown    Medications  No current facility-administered medications for this encounter.  Current Outpatient Medications:    acetaminophen (TYLENOL) 325 MG tablet, Take 650 mg by mouth every 6 (six) hours as needed., Disp: , Rfl:    apixaban (ELIQUIS) 5 MG TABS tablet, Take 1 tablet (5 mg total) by mouth 2 (two) times daily., Disp: 60 tablet, Rfl: 0   atorvastatin (LIPITOR) 80 MG tablet, Take 1 tablet (80 mg total) by mouth daily., Disp: 30 tablet, Rfl: 0   butalbital-acetaminophen-caffeine (FIORICET) 50-325-40 MG tablet, Take 1  tablet by mouth every 4 (four) hours as needed for headache. maximum 2 tablets per day, Disp: 6 tablet, Rfl: 0   hydrALAZINE (APRESOLINE) 50 MG tablet, Take 1 tablet (50 mg total) by mouth 3 (three) times daily as needed (If systolic BP greater than 150 mmHg.). If systolic BP greater than 150 mmHg., Disp: 90 tablet, Rfl: 0   methocarbamol (ROBAXIN) 500 MG tablet, Take 1 tablet (500 mg total) by mouth every 8 (eight) hours as needed for muscle spasms., Disp: 90 tablet, Rfl: 0   rOPINIRole (REQUIP) 0.5 MG tablet, Take 1 tablet (0.5 mg total) by mouth at bedtime as needed. For restless leg syndrome, Disp: 30 tablet, Rfl: 0   tadalafil (CIALIS) 20 MG tablet, 1 tablet by mouth 1 hour prior to intercourse, Disp: 30 tablet, Rfl: 6  Vitals   Vitals:   08/19/23 0625 08/19/23 0626 08/19/23 0630 08/19/23 0636  BP:   (!) 148/79   Pulse: 85 80 79 76  Resp: (!) 22 17 16 18   Temp:      TempSrc:      SpO2: 97% 96% 97% 97%  Weight:      Height:        Body mass index is 25.09 kg/m.  Physical Exam   Constitutional: Appears well-developed and well-nourished.  Psych: Affect appropriate to situation, somewhat flat but cooperative and calm, mildly restless Eyes: No scleral injection.  Head: Normocephalic. No OP obstruction.  Cardiovascular: Normal rate and regular rhythm. Respiratory: Effort normal, non-labored breathing.  GI: Soft.  No distension. There is no tenderness.  Skin: WDI.   Neurologic Examination   Neuro: Mental Status: Patient is awake, alert, oriented to person, place, age, month and situation. Patient is able to give a clear and coherent history. No signs of aphasia or neglect Cranial Nerves: II: Visual Fields are full. Pupils are equal, round, and reactive to light.  Normal funduscopic exam on the right.  Left eye patient had more difficulties tolerating exam and fundus was incompletely visualized III,IV, VI: EOMI without ptosis or diploplia.  V: Facial sensation is symmetric to  light touch VII: Facial movement is notable for a right facial droop VIII: hearing is intact to voice X: Uvula elevates symmetrically XI: Shoulder shrug is symmetric. XII: tongue is midline without atrophy or fasciculations.  Motor: Tone is normal. Bulk is normal. 5/5 strength was present in all four extremities, other than mild right deltoid weakness 4+/5, possibly very slight right knee flexion weakness 4++/5.  Leans slightly to the right when sitting/standing Sensory: Sensation is symmetric to light touch and temperature in the arms and legs. Cerebellar: FNF and HKS are intact bilaterally   Labs/Imaging/Neurodiagnostic studies   CBC:  Recent  Labs  Lab 08/13/23 0644 08/14/23 0451 08/19/23 0523  WBC 5.5 5.8 7.3  NEUTROABS 1.9  --   --   HGB 13.9 14.2 14.6  HCT 41.3 41.8 42.7  MCV 94.3 92.1 90.9  PLT 252 270 301   Basic Metabolic Panel:  Lab Results  Component Value Date   NA 141 08/19/2023   K 4.1 08/19/2023   CO2 27 08/19/2023   GLUCOSE 130 (H) 08/19/2023   BUN 19 08/19/2023   CREATININE 1.17 08/19/2023   CALCIUM 9.7 08/19/2023   GFRNONAA >60 08/19/2023   Lipid Panel:  Lab Results  Component Value Date   LDLCALC 123 (H) 08/11/2023   HgbA1c:  Lab Results  Component Value Date   HGBA1C 5.1 08/10/2023   Urine Drug Screen:     Component Value Date/Time   LABOPIA NONE DETECTED 08/13/2023 0945   COCAINSCRNUR NONE DETECTED 08/13/2023 0945   LABBENZ NONE DETECTED 08/13/2023 0945   AMPHETMU NONE DETECTED 08/13/2023 0945   THCU NONE DETECTED 08/13/2023 0945   LABBARB POSITIVE (A) 08/13/2023 0945    Alcohol Level     Component Value Date/Time   ETH <10 08/13/2023 0644   INR  Lab Results  Component Value Date   INR 1.1 08/13/2023   APTT  Lab Results  Component Value Date   APTT 29 08/13/2023   AED levels: No results found for: "PHENYTOIN", "ZONISAMIDE", "LAMOTRIGINE", "LEVETIRACETA"  CT Head without contrast(Personally reviewed):  Acute basal  ganglia infarcts without hemorrhage or progression when compared to recent brain MRI.  1/25 CT angio Head and Neck with contrast: 1. Normal CTA of the neck. No significant stenosis, dissection, or aneurysm is present. 2. Minimal atherosclerotic calcifications within the cavernous internal carotid arteries bilaterally without significant stenosis. 3. Normal CT perfusion. 4. 3.2 cm nodule in the inferior left lobe of the thyroid. Recommend non-emergent thyroid ultrasound. Reference: J Am Coll Radiol. 2015 Feb;12(2): 143-50  MRI Brain(Personally reviewed): Known acute infarct in the left basal ganglia and corona radiata, more confluent than seen on 08/13/2023 scan but still in the same distribution. No new territory infarct or interval hemorrhage.  Echocardiogram 1/26 with grade 1 diastolic dysfunction otherwise reassuring  EEG 1/28 normal  ASSESSMENT   GUENTHER DUNSHEE is a 68 y.o. male presenting with persistent headache after recent stroke.  Given the stuttering symptoms described with initial right sided weakness actually on 1/25 that was transient, as well as imaging findings with slight increase in confluence of the stroke on imaging today, I do think that his current stroke is most likely related to small vessel disease rather than to atrial fibrillation.  If he has further small vessel strokes would consider adding aspirin to Eliquis but at this time do not feel that there is sufficient benefit to outweigh the risk of intracerebral hemorrhage  Extended conversation with wife at bedside, patient and daughter (cardiology PA) over the phone, answering their questions about when to present for strokelike symptoms, importance of calling 911, reviewing BEFAST criteria, etc.  Considered starting verapamil for stroke and headache prevention (heart rates here have been reassuring), however daughter noted he often has resting bradycardia into the 50s and so this was deferred  Will additionally  screen for infectious/inflammatory etiologies (HA, RPR, ESR, CRP), and nutritional deficiencies or excesses that can contribute to neurological symptoms (thiamine, B12, MMA copper, TSH) -- given overall patient does not have significant risk factors but these would be potentially treatable causes, it is reasonable for him to follow-up the  studies with his primary care physician  RECOMMENDATIONS  -HIV, RPR, B12, MMA, B1, TSH, copper, ESR, CRP  -B12 goal greater than 600; would additionally supplement B12 if MMA is high suggesting a functional B12 deficiency -Do not feel that adding aspirin at this time for small vessel disease would be worth the risk of increased ICH in the setting of Eliquis being used for his atrial fibrillation -Eliquis should be continued -Patient counseled on the risk of medication overuse headache -On further review of medication list, Cialis and ropinirole could be contributing to headache and would recommend avoiding these medications at this time -Okay to start amlodipine as planned with goal of gradually achieving normotension as he is more than 48 hours out from his initial stroke symptoms and he has not had any neurological worsening -Close follow-up with PCP ______________________________________________________________________   Signed, Gordy Councilman, MD [facility]  Greater than 80 minutes spent in care of this patient, majority at bedside

## 2023-08-20 ENCOUNTER — Emergency Department: Payer: 59 | Admitting: Physical Therapy

## 2023-08-20 ENCOUNTER — Other Ambulatory Visit: Payer: Self-pay

## 2023-08-20 ENCOUNTER — Encounter: Payer: Self-pay | Admitting: Physical Therapy

## 2023-08-20 DIAGNOSIS — R2689 Other abnormalities of gait and mobility: Secondary | ICD-10-CM

## 2023-08-20 DIAGNOSIS — R2681 Unsteadiness on feet: Secondary | ICD-10-CM

## 2023-08-20 DIAGNOSIS — R278 Other lack of coordination: Secondary | ICD-10-CM

## 2023-08-20 DIAGNOSIS — R269 Unspecified abnormalities of gait and mobility: Secondary | ICD-10-CM

## 2023-08-20 LAB — RPR: RPR Ser Ql: NONREACTIVE

## 2023-08-20 NOTE — Therapy (Signed)
 OUTPATIENT PHYSICAL THERAPY NEURO EVALUATION   Patient Name: Gordon Garcia MRN: [id number] DOB:09-24-1955, 68 y.o., male Today's Date: 08/20/2023   PCP: Gordon Amato, MD  REFERRING PROVIDER: Salli Amato, MD   END OF SESSION:  PT End of Session - 08/20/23 9147     Visit Number 1    Number of Visits 16    Date for PT Re-Evaluation 10/15/23    PT Start Time 0850    PT Stop Time 0930    PT Time Calculation (min) 40 min    Equipment Utilized During Treatment Gait belt    Activity Tolerance Patient tolerated treatment well    Behavior During Therapy WFL for tasks assessed/performed             Past Medical History:  Diagnosis Date   Allergic rhinitis    Atrial flutter (HCC)    GERD (gastroesophageal reflux disease)    HTN (hypertension)    Past Surgical History:  Procedure Laterality Date   COLONOSCOPY WITH PROPOFOL  N/A 02/14/2021   Procedure: COLONOSCOPY WITH PROPOFOL ;  Surgeon: Gordon Carmine, MD;  Location: [facility] ENDOSCOPY;  Service: Endoscopy;  Laterality: N/A;   Patient Active Problem List   Diagnosis Date Noted   Atrial fibrillation (HCC) 08/13/2023   Leg cramps 08/13/2023   CVA (cerebral vascular accident) (HCC) 08/10/2023   TIA (transient ischemic attack) 08/10/2023   Hx of colonic polyps    Erectile dysfunction due to diseases classified elsewhere 09/04/2019   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   Allergic rhinitis due to pollen 05/06/2014   Gastroesophageal reflux disease without esophagitis 05/06/2014   Hypertension 05/06/2014   Hypertriglyceridemia 05/06/2014    ONSET DATE: 08/13/2023  REFERRING DIAG:  Diagnosis  I63.9 (ICD-10-CM) - CVA (cerebral vascular accident) (HCC)    THERAPY DIAG:  Unsteadiness on feet  Balance disorder  Other lack of coordination  Abnormality of gait  Rationale for Evaluation and Treatment: Rehabilitation  SUBJECTIVE:                                                                                                                                                                                              SUBJECTIVE STATEMENT: Pt reports to pt with new CVA with mild aphasia. States that since CVA, his balance, strength and coordination have been affect. Sister present with pt; questions about the benefits and OT and SLP, and hoping to get referral for additional services.   Pt accompanied by: family member sister Gordon Garcia   PERTINENT HISTORY:  Gordon Garcia is a 68 y.o. male with medical history significant for paroxysmal a flutter off anticoagulation, HTN, chronic headache and recent  hospitalization for stroke.  - In 08/10/23, patient was hospitalized for stroke with MRI brain that showed old infarcts and acute small vessel infarcts in bilateral basal ganglia. - He presented initially with TIA symptoms on 1/25, MRI brain was normal and only showed old infarcts - On 1/28 he had similar symptoms, right sided weakness and word finding difficulty, went back to ED, and this time MRI showed acute stroke, 3 small spots in basal ganglia.  - Notably, he stopped taking anticoagulation for 6 months     PAIN:  Are you having pain? No  PRECAUTIONS: None  RED FLAGS: None   WEIGHT BEARING RESTRICTIONS: No  FALLS: Has patient fallen in last 6 months? No  LIVING ENVIRONMENT: Lives with: lives with their spouse Lives in: House/apartment Stairs: Yes: External: 6 steps; none Has following equipment at home: None  PLOF: Independent and Independent with basic ADLs  PATIENT GOALS: improve speech and coordination. 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  DIAGNOSTIC FINDINGS:  MRI 2/30 IMPRESSION: Known acute infarct in the left basal ganglia and corona radiata, more confluent than seen on 08/13/2023 scan but still in the same distribution. No new territory infarct or interval hemorrhage.  COGNITION: Overall  cognitive status: Within functional limits for tasks assessed and new speech impairmments from CVA    SENSATION: WFL  COORDINATION: Mild decreased Speed with ankle to knee on the RLE  Finger to nose: decreased speed on the LLE.   EDEMA:  WFL  MUSCLE TONE:WFL    POSTURE: rounded shoulders and forward head  LOWER EXTREMITY ROM:     Active  Right Eval Left Eval  Hip flexion [facility]  Hip extension    Hip abduction [facility]  Hip adduction [facility]  Hip internal rotation    Hip external rotation    Knee flexion [facility] WFL  Knee extension [facility] WFL  Ankle dorsiflexion    Ankle plantarflexion    Ankle inversion    Ankle eversion     (Blank rows = not tested)  LOWER EXTREMITY MMT:    MMT Right Eval Left Eval  Hip flexion 4+ 4+  Hip extension    Hip abduction 5 5  Hip adduction 4+ 4+  Hip internal rotation    Hip external rotation    Knee flexion 4+ 5  Knee extension 5 5  Ankle dorsiflexion 4+ 4+  Ankle plantarflexion    Ankle inversion    Ankle eversion    (Blank rows = not tested)  BED MOBILITY:  Sit to supine Complete Independence Supine to sit Complete Independence  TRANSFERS: Assistive device utilized: None  Sit to stand: Complete Independence Stand to sit: Complete Independence Chair to chair: Complete Independence Floor: Min A  RAMP:  Level of Assistance: Complete Independence Assistive device utilized: None Ramp Comments:   CURB:  Level of Assistance: CGA Assistive device utilized: None Curb Comments:   STAIRS: Level of Assistance: Modified independence Stair Negotiation Technique: Alternating Pattern  with Single Rail on Right Number of Stairs: 4  Height of Stairs: 6  Comments:   GAIT: Gait pattern: decreased arm swing- Right, decreased arm swing- Left, and decreased stride length Distance walked: 80 Assistive device utilized: None Level of assistance: SBA Comments: decreased step length. Pt reports feeling unsteady at times with  ambulation. No overt deficits noted   FUNCTIONAL TESTS:  5 times sit to stand: 13.59 Timed  up and go (TUG): 12.58 6 minute walk test: to be completed  10 meter walk test: 10.58 and 10.69 Berg Balance Scale: 47 Functional gait assessment: 22   [facility] PT Assessment - 08/20/23 0001       Berg Balance Test   Sit to Stand Able to stand without using hands and stabilize independently    Standing Unsupported Able to stand safely 2 minutes    Sitting with Back Unsupported but Feet Supported on Floor or Stool Able to sit safely and securely 2 minutes    Stand to Sit Sits safely with minimal use of hands    Transfers Able to transfer safely, minor use of hands    Standing Unsupported with Eyes Closed Able to stand 10 seconds with supervision    Standing Unsupported with Feet Together Able to place feet together independently and stand for 1 minute with supervision    From Standing, Reach Forward with Outstretched Arm Can reach confidently >25 cm (10)    From Standing Position, Pick up Object from Floor Able to pick up shoe, needs supervision    From Standing Position, Turn to Look Behind Over each Shoulder Looks behind from both sides and weight shifts well    Turn 360 Degrees Able to turn 360 degrees safely but slowly    Standing Unsupported, Alternately Place Feet on Step/Stool Able to stand independently and complete 8 steps >20 seconds    Standing Unsupported, One Foot in Front Able to plae foot ahead of the other independently and hold 30 seconds    Standing on One Leg Able to lift leg independently and hold equal to or more than 3 seconds    Total Score 47      Functional Gait  Assessment   Gait Level Surface Walks 20 ft in less than 5.5 sec, no assistive devices, good speed, no evidence for imbalance, normal gait pattern, deviates no more than 6 in outside of the 12 in walkway width.    Change in Gait Speed Able to smoothly change walking speed without loss of balance or gait deviation.  Deviate no more than 6 in outside of the 12 in walkway width.    Gait with Horizontal Head Turns Performs head turns smoothly with no change in gait. Deviates no more than 6 in outside 12 in walkway width    Gait with Vertical Head Turns Performs task with slight change in gait velocity (eg, minor disruption to smooth gait path), deviates 6 - 10 in outside 12 in walkway width or uses assistive device    Gait and Pivot Turn Pivot turns safely within 3 sec and stops quickly with no loss of balance.    Step Over Obstacle Is able to step over one shoe box (4.5 in total height) but must slow down and adjust steps to clear box safely. May require verbal cueing.    Gait with Narrow Base of Support Ambulates 7-9 steps.    Gait with Eyes Closed Walks 20 ft, slow speed, abnormal gait pattern, evidence for imbalance, deviates 10-15 in outside 12 in walkway width. Requires more than 9 sec to ambulate 20 ft.    Ambulating Backwards Walks 20 ft, uses assistive device, slower speed, mild gait deviations, deviates 6-10 in outside 12 in walkway width.    Steps Alternating feet, must use rail.    Total Score 22              PATIENT SURVEYS:  Stroke Impact Scale 59/80  TREATMENT DATE: 08/20/2023   Eval Only   PATIENT EDUCATION: Education details: POC, benefits of SLP and OT; recovery timeline  Person educated: Patient and sister Education method: Explanation Education comprehension: verbalized understanding  HOME EXERCISE PROGRAM: To be given at visit 2   GOALS: Goals reviewed with patient? Yes   SHORT TERM GOALS: Target date: 09/17/2023    Patient will be independent in home exercise program to improve strength/mobility for better functional independence with ADLs. Baseline: to be given at visit 2  Goal status: INITIAL   LONG TERM GOALS: Target date: 10/15/2023     Patient will increase SIS score by equal to or greater than  10 points  to demonstrate statistically significant improvement in mobility and quality of life.  Baseline: 59 Goal status: INITIAL  2.  Patient (> 92 years old) will increase 6 min walk test by >173ft to indicate improve activity tolerance, and access to community   Baseline: TBD Goal status: INITIAL  3.  Patient will increase Berg Balance score by > 6 points to demonstrate decreased fall risk during functional activities Baseline: 47 Goal status: INITIAL  4.  Patient will increase 10 meter walk test to >1.48m/s as to improve gait speed for better community ambulation and to reduce fall risk. Baseline: 0.54m/s Goal status: INITIAL  5.  Patient will reduce timed up and go to <11 seconds to reduce fall risk and demonstrate improved transfer/gait ability. Baseline: 12.58 Goal status: INITIAL  6.  Patient will increase FGA score by 3 points  as to demonstrate reduced fall risk and improved dynamic gait balance for better safety with community/home ambulation.   Baseline: 22 Goal status: INITIAL   ASSESSMENT:  CLINICAL IMPRESSION: Patient is a 69 y.o. male  who was seen today for physical therapy evaluation and treatment for balance and coordination deficits following recent CVA. Pt demonstrated mild coordination deficits with decreased speed on ankle to knee as well as mild balance deficts indicated by decreased gait speed, increased TUG,  and decreased scores on Berg and FGA. Narrow stance and single limb tasks are the most  challenging. Pt will benefit from skilled PT to address balance and coordination deficits to allow return to PLOF and improve overall QoL.    OBJECTIVE IMPAIRMENTS: Abnormal gait, decreased activity tolerance, decreased balance, decreased coordination, decreased endurance, and decreased knowledge of condition.   ACTIVITY LIMITATIONS: stairs, locomotion level, and caring for others  PARTICIPATION  LIMITATIONS: cleaning, interpersonal relationship, driving, shopping, community activity, occupation, and yard work  PERSONAL FACTORS: 1 comorbidity: HTN and paroxysmal a flutter   are also affecting patient's functional outcome.   REHAB POTENTIAL: Excellent  CLINICAL DECISION MAKING: Stable/uncomplicated  EVALUATION COMPLEXITY: Moderate  PLAN:  PT FREQUENCY: 1-2x/week  PT DURATION: 8 weeks  PLANNED INTERVENTIONS: 97110-Therapeutic exercises, 97530- Therapeutic activity, 97112- Neuromuscular re-education, 97535- Self Care, [id number]- Manual therapy, [phone]- Gait training, Patient/Family education, Balance training, Stair training, Taping, Dry Needling, Joint mobilization, Joint manipulation, DME instructions, Cryotherapy, and Moist heat  PLAN FOR NEXT SESSION:  Complete 6 min walk test.  Initiate balance HEP    Massie FORBES Dollar, PT 08/20/2023, 3:25 PM

## 2023-08-21 LAB — METHYLMALONIC ACID, SERUM: Methylmalonic Acid, Quantitative: 90 nmol/L (ref 0–378)

## 2023-08-21 LAB — COPPER, SERUM: Copper: 93 ug/dL (ref 69–132)

## 2023-08-22 ENCOUNTER — Ambulatory Visit: Payer: Managed Care, Other (non HMO) | Admitting: Physical Therapy

## 2023-08-23 ENCOUNTER — Ambulatory Visit: Payer: 59 | Attending: Internal Medicine | Admitting: Physical Therapy

## 2023-08-23 ENCOUNTER — Telehealth: Payer: Self-pay | Admitting: Physical Therapy

## 2023-08-23 DIAGNOSIS — M6281 Muscle weakness (generalized): Secondary | ICD-10-CM | POA: Diagnosis present

## 2023-08-23 DIAGNOSIS — R41841 Cognitive communication deficit: Secondary | ICD-10-CM | POA: Diagnosis present

## 2023-08-23 DIAGNOSIS — R2681 Unsteadiness on feet: Secondary | ICD-10-CM | POA: Diagnosis present

## 2023-08-23 DIAGNOSIS — R471 Dysarthria and anarthria: Secondary | ICD-10-CM | POA: Diagnosis present

## 2023-08-23 DIAGNOSIS — R4701 Aphasia: Secondary | ICD-10-CM | POA: Insufficient documentation

## 2023-08-23 DIAGNOSIS — R278 Other lack of coordination: Secondary | ICD-10-CM | POA: Insufficient documentation

## 2023-08-23 DIAGNOSIS — R2689 Other abnormalities of gait and mobility: Secondary | ICD-10-CM | POA: Diagnosis present

## 2023-08-23 DIAGNOSIS — R269 Unspecified abnormalities of gait and mobility: Secondary | ICD-10-CM | POA: Insufficient documentation

## 2023-08-23 NOTE — Therapy (Signed)
 OUTPATIENT PHYSICAL THERAPY NEURO TREATMENT   Patient Name: Gordon Garcia MRN: [id number] DOB:10-29-1955, 68 y.o., male Today's Date: 08/23/2023   PCP: Salli Amato, MD  REFERRING PROVIDER: Salli Amato, MD   END OF SESSION:  PT End of Session - 08/23/23 0926     Visit Number 2    Number of Visits 16    Date for PT Re-Evaluation 10/15/23    PT Start Time 0932    PT Stop Time 1015    PT Time Calculation (min) 43 min    Equipment Utilized During Treatment Gait belt    Activity Tolerance Patient tolerated treatment well    Behavior During Therapy WFL for tasks assessed/performed             Past Medical History:  Diagnosis Date   Allergic rhinitis    Atrial flutter (HCC)    GERD (gastroesophageal reflux disease)    HTN (hypertension)    Past Surgical History:  Procedure Laterality Date   COLONOSCOPY WITH PROPOFOL  N/A 02/14/2021   Procedure: COLONOSCOPY WITH PROPOFOL ;  Surgeon: Jinny Carmine, MD;  Location: [facility] ENDOSCOPY;  Service: Endoscopy;  Laterality: N/A;   Patient Active Problem List   Diagnosis Date Noted   Atrial fibrillation (HCC) 08/13/2023   Leg cramps 08/13/2023   CVA (cerebral vascular accident) (HCC) 08/10/2023   TIA (transient ischemic attack) 08/10/2023   Hx of colonic polyps    Erectile dysfunction due to diseases classified elsewhere 09/04/2019   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   Allergic rhinitis due to pollen 05/06/2014   Gastroesophageal reflux disease without esophagitis 05/06/2014   Hypertension 05/06/2014   Hypertriglyceridemia 05/06/2014    ONSET DATE: 08/13/2023  REFERRING DIAG:  Diagnosis  I63.9 (ICD-10-CM) - CVA (cerebral vascular accident) (HCC)    THERAPY DIAG:  Unsteadiness on feet  Abnormality of gait  Balance disorder  Other lack of coordination  Rationale for Evaluation and Treatment: Rehabilitation  SUBJECTIVE:                                                                                                                                                                                              SUBJECTIVE STATEMENT: Pt reports to PT feeling pretty good. No falls, trips or LOB since PT evaluation. No pain at time of treatment.   Plans to watch the Superbowl this weekend.     Pt accompanied by: family member sister Beryl   PERTINENT HISTORY:  Gordon Garcia is a 68 y.o. male with medical history significant for paroxysmal a flutter off anticoagulation, HTN, chronic headache and recent hospitalization for stroke.  - In 08/10/23, patient was hospitalized  for stroke with MRI brain that showed old infarcts and acute small vessel infarcts in bilateral basal ganglia. - He presented initially with TIA symptoms on 1/25, MRI brain was normal and only showed old infarcts - On 1/28 he had similar symptoms, right sided weakness and word finding difficulty, went back to ED, and this time MRI showed acute stroke, 3 small spots in basal ganglia.  - Notably, he stopped taking anticoagulation for 6 months  From Eval: pt reports to PT with new CVA with mild aphasia. States that since CVA, his balance, strength and coordination have been affect. Sister present with pt; questions about the benefits and OT and SLP, and hoping to get referral for additional services.    PAIN:  Are you having pain? No  PRECAUTIONS: None  RED FLAGS: None   WEIGHT BEARING RESTRICTIONS: No  FALLS: Has patient fallen in last 6 months? No  LIVING ENVIRONMENT: Lives with: lives with their spouse Lives in: House/apartment Stairs: Yes: External: 6 steps; none Has following equipment at home: None  PLOF: Independent and Independent with basic ADLs  PATIENT GOALS: improve speech and coordination. 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  DIAGNOSTIC FINDINGS:  MRI 2/30 IMPRESSION: Known acute infarct in the left basal ganglia  and corona radiata, more confluent than seen on 08/13/2023 scan but still in the same distribution. No new territory infarct or interval hemorrhage.  COGNITION: Overall cognitive status: Within functional limits for tasks assessed and new speech impairmments from CVA    SENSATION: WFL  COORDINATION: Mild decreased Speed with ankle to knee on the RLE  Finger to nose: decreased speed on the LLE.   EDEMA:  WFL  MUSCLE TONE:WFL    POSTURE: rounded shoulders and forward head  LOWER EXTREMITY ROM:     Active  Right Eval Left Eval  Hip flexion [facility]  Hip extension    Hip abduction [facility]  Hip adduction [facility]  Hip internal rotation    Hip external rotation    Knee flexion [facility] WFL  Knee extension [facility] WFL  Ankle dorsiflexion    Ankle plantarflexion    Ankle inversion    Ankle eversion     (Blank rows = not tested)  LOWER EXTREMITY MMT:    MMT Right Eval Left Eval  Hip flexion 4+ 4+  Hip extension    Hip abduction 5 5  Hip adduction 4+ 4+  Hip internal rotation    Hip external rotation    Knee flexion 4+ 5  Knee extension 5 5  Ankle dorsiflexion 4+ 4+  Ankle plantarflexion    Ankle inversion    Ankle eversion    (Blank rows = not tested)  BED MOBILITY:  Sit to supine Complete Independence Supine to sit Complete Independence  TRANSFERS: Assistive device utilized: None  Sit to stand: Complete Independence Stand to sit: Complete Independence Chair to chair: Complete Independence Floor: Min A  RAMP:  Level of Assistance: Complete Independence Assistive device utilized: None Ramp Comments:   CURB:  Level of Assistance: CGA Assistive device utilized: None Curb Comments:   STAIRS: Level of Assistance: Modified independence Stair Negotiation Technique: Alternating Pattern  with Single Rail on Right Number of Stairs: 4  Height of Stairs: 6  Comments:   GAIT: Gait pattern: decreased arm swing- Right, decreased arm swing- Left, and decreased  stride length Distance walked: 80 Assistive device  utilized: None Level of assistance: SBA Comments: decreased step length. Pt reports feeling unsteady at times with ambulation. No overt deficits noted   FUNCTIONAL TESTS:  5 times sit to stand: 13.59 Timed up and go (TUG): 12.58 6 minute walk test: 1311ft 10 meter walk test: 10.58 and 10.69 Berg Balance Scale: 47 Functional gait assessment: 22      PATIENT SURVEYS:  Stroke Impact Scale 59/80                                                                                                                              TREATMENT DATE: 08/23/2023   6 Min Walk Test:  Instructed patient to ambulate as quickly and as safely as possible for 6 minutes using LRAD. Patient was allowed to take standing rest breaks without stopping the test, but if the patient required a sitting rest break the clock would be stopped and the test would be over.  Results: 1334ft feet (420 meters) using no AD with supervision assist. Pt reports feeling a weight on the R side with fatigue and also hitting door frame on the R side intermittently.   Results indicate that the patient has reduced endurance with ambulation compared to age matched norms.  Age Matched Norms: 81-69 yo M: 12 F: 41, 58-79 yo M: 63 F: 471, 49-89 yo M: 417 F: 392 MDC: 58.21 meters (190.98 feet) or 50 meters (ANPTA Core Set of Outcome Measures for Adults with Neurologic Conditions, 2018)  Standing NMR Feet together, eyes open, eyes closed x 10 each x 4 bouts  Tandem stand at rail 15 sec bil x 5  SLS 10 sec x 3 each.  Feet together eyes closed head turns 2 x 30 sec  Feet together eyesy closed head nods 2 x 30 sec  Step up/back from 6 inch step x 12 bil. Emphasis on eccentric control.   CGA from PT for safety to improve midline awareness and reduce lateral LOB to the R. Improve with increased repetitions   PATIENT EDUCATION: Education details: POC, Pt educated throughout session about  proper posture and technique with exercises. Improved exercise technique, movement at target joints, use of target muscles after min to mod verbal, visual, tactile cues.  Person educated: Patient and sister Education method: Explanation Education comprehension: verbalized understanding  HOME EXERCISE PROGRAM: Access Code: [email or web address] URL: [email or web address] Date: 08/23/2023 Prepared by: Massie Dollar  Exercises - Tandem Stance with Support  - 1 x daily - 7 x weekly - 3 sets - 6 reps - 20 hold - Single Leg Stance with Support  - 1 x daily - 7 x weekly - 3 sets - 6 reps - 20 hold - Feet Together Balance at [employer] Eyes Closed  - 1 x daily - 7 x weekly - 3 sets - 5 reps - 15 hold - Corner Balance Feet Together: Eyes Closed With Head Turns  - 1 x daily - 7 x weekly - 3 sets -  15 reps - 2 hold  GOALS: Goals reviewed with patient? Yes   SHORT TERM GOALS: Target date: 09/17/2023    Patient will be independent in home exercise program to improve strength/mobility for better functional independence with ADLs. Baseline: to be given at visit 2  Goal status: INITIAL   LONG TERM GOALS: Target date: 10/15/2023    Patient will increase SIS score by equal to or greater than  10 points  to demonstrate statistically significant improvement in mobility and quality of life.  Baseline: 59 Goal status: INITIAL  2.  Patient (> 8 years old) will increase 6 min walk test by >194ft to indicate improve activity tolerance, and access to community   Baseline: 1360ft Goal status: INITIAL  3.  Patient will increase Berg Balance score by > 6 points to demonstrate decreased fall risk during functional activities Baseline: 47 Goal status: INITIAL  4.  Patient will increase 10 meter walk test to >1.33m/s as to improve gait speed for better community ambulation and to reduce fall risk. Baseline: 0.10m/s Goal status: INITIAL  5.  Patient will reduce timed up and go to <11 seconds to reduce  fall risk and demonstrate improved transfer/gait ability. Baseline: 12.58 Goal status: INITIAL  6.  Patient will increase FGA score by 3 points  as to demonstrate reduced fall risk and improved dynamic gait balance for better safety with community/home ambulation.   Baseline: 22 Goal status: INITIAL   ASSESSMENT:  CLINICAL IMPRESSION: Patient is a 68 y.o. male  who was seen today for physical therapy treatment for balance and coordination deficits following recent CVA.  Completed 6 min walk test, mildly reduced from aged matched norm of only 1351ft. Treatment focused on static balance HEP with improved awareness of lateral LOB to the R due to midline disorientation. Pt tolerated treatment well.  Pt will benefit from skilled PT to address balance and coordination deficits to allow return to PLOF and improve overall QoL.    OBJECTIVE IMPAIRMENTS: Abnormal gait, decreased activity tolerance, decreased balance, decreased coordination, decreased endurance, and decreased knowledge of condition.   ACTIVITY LIMITATIONS: stairs, locomotion level, and caring for others  PARTICIPATION LIMITATIONS: cleaning, interpersonal relationship, driving, shopping, community activity, occupation, and yard work  PERSONAL FACTORS: 1 comorbidity: HTN and paroxysmal a flutter   are also affecting patient's functional outcome.   REHAB POTENTIAL: Excellent  CLINICAL DECISION MAKING: Stable/uncomplicated  EVALUATION COMPLEXITY: Moderate  PLAN:  PT FREQUENCY: 1-2x/week  PT DURATION: 8 weeks  PLANNED INTERVENTIONS: 97110-Therapeutic exercises, 97530- Therapeutic activity, [id number]- Neuromuscular re-education, 97535- Self Care, [id number]- Manual therapy, [phone]- Gait training, Patient/Family education, Balance training, Stair training, Taping, Dry Needling, Joint mobilization, Joint manipulation, DME instructions, Cryotherapy, and Moist heat  PLAN FOR NEXT SESSION:   Dynamic balance training with narrow and single  stance. Generalized strengthening   Massie FORBES Dollar, PT 08/23/2023, 9:27 AM

## 2023-08-23 NOTE — Telephone Encounter (Signed)
 LVM for patient to call and verify they recieved message adding OT and SLP to the Feb 11 PT. Left our number and day/times of evals.

## 2023-08-26 LAB — VITAMIN B1: Vitamin B1 (Thiamine): 106.9 nmol/L (ref 66.5–200.0)

## 2023-08-27 ENCOUNTER — Ambulatory Visit: Payer: Managed Care, Other (non HMO)

## 2023-08-27 ENCOUNTER — Telehealth: Payer: Self-pay | Admitting: Physical Therapy

## 2023-08-27 ENCOUNTER — Ambulatory Visit: Payer: Managed Care, Other (non HMO) | Admitting: Occupational Therapy

## 2023-08-27 ENCOUNTER — Ambulatory Visit: Payer: Managed Care, Other (non HMO) | Admitting: Physical Therapy

## 2023-08-27 DIAGNOSIS — R41841 Cognitive communication deficit: Secondary | ICD-10-CM

## 2023-08-27 DIAGNOSIS — M6281 Muscle weakness (generalized): Secondary | ICD-10-CM

## 2023-08-27 DIAGNOSIS — R278 Other lack of coordination: Secondary | ICD-10-CM

## 2023-08-27 DIAGNOSIS — R4701 Aphasia: Secondary | ICD-10-CM

## 2023-08-27 DIAGNOSIS — R471 Dysarthria and anarthria: Secondary | ICD-10-CM

## 2023-08-27 DIAGNOSIS — R2681 Unsteadiness on feet: Secondary | ICD-10-CM | POA: Diagnosis not present

## 2023-08-27 NOTE — Therapy (Signed)
OUTPATIENT SPEECH LANGUAGE PATHOLOGY APHASIA EVALUATION   Patient Name: Gordon Garcia MRN: [id number] DOB:1955-10-09, 68 y.o., male Today's Date: 08/27/2023  PCP: Carmelina Dane, MD REFERRING PROVIDER: as above   End of Session - 08/27/23 1207     Visit Number 1    Number of Visits 24    Date for SLP Re-Evaluation 11/19/23    SLP Start Time 0845    SLP Stop Time  0930    SLP Time Calculation (min) 45 min             Past Medical History:  Diagnosis Date   Allergic rhinitis    Atrial flutter (HCC)    GERD (gastroesophageal reflux disease)    HTN (hypertension)    Past Surgical History:  Procedure Laterality Date   COLONOSCOPY WITH PROPOFOL N/A 02/14/2021   Procedure: COLONOSCOPY WITH PROPOFOL;  Surgeon: Midge Minium, MD;  Location: [facility] ENDOSCOPY;  Service: Endoscopy;  Laterality: N/A;   Patient Active Problem List   Diagnosis Date Noted   Atrial fibrillation (HCC) 08/13/2023   Leg cramps 08/13/2023   CVA (cerebral vascular accident) (HCC) 08/10/2023   TIA (transient ischemic attack) 08/10/2023   Hx of colonic polyps    Erectile dysfunction due to diseases classified elsewhere 09/04/2019   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   Allergic rhinitis due to pollen 05/06/2014   Gastroesophageal reflux disease without esophagitis 05/06/2014   Hypertension 05/06/2014   Hypertriglyceridemia 05/06/2014    ONSET DATE: 08/10/23 (CVA);   REFERRING DIAG: 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 Pt accompanied by: self  PERTINENT HISTORY: Gordon Garcia is a 68 y.o. male with medical history significant for paroxysmal a flutter off anticoagulation, HTN, chronic headache and recent hospitalization for stroke.  - In 08/10/23, patient was hospitalized for stroke with MRI  brain that showed old infarcts and acute small vessel infarcts in bilateral basal ganglia. - He presented initially with TIA symptoms on 1/25, MRI brain was normal and only showed old infarcts - On 1/28 he had similar symptoms, right sided weakness and word finding difficulty, went back to ED, and this time MRI showed acute stroke, 3 small spots in basal ganglia.  - Notably, he stopped taking anticoagulation for 6 months    PAIN:  Are you having pain? No  FALLS: Has patient fallen in last 6 months?  No  LIVING ENVIRONMENT: Lives with: lives with their spouse Lives in: House/apartment  PLOF:  Level of assistance: Independent with ADLs, Independent with IADLs Employment: Full-time employment prior to CVA   PATIENT GOALS    for speech and cognition to improve   OBJECTIVE:  COGNITION: Overall cognitive status: Impaired and Difficulty to assess due to: Communication impairment; to be further assessed in upcoming sessions  AUDITORY COMPREHENSION: Overall auditory comprehension: Appears intact with self correction   READING COMPREHENSION: Impaired: paragraph  EXPRESSION: verbal  VERBAL EXPRESSION: Overall verbal expression: Impaired: moderately complex and complex Level of generative/spontaneous verbalization: sentence Automatic speech: name: intact and social response: intact  Repetition: Appears intact Naming: Responsive: WFL, Confrontation: WFL, and Divergent: 6 animals in 9s Pragmatics: Impaired: abnormal effect and topic maintenance Comments: flat affect; reduced topic maintenance due to aphasia  WRITTEN EXPRESSION: Dominant hand: right  Written expression: Impaired: phrase and sentence  MOTOR SPEECH: Overall motor speech: Impaired Level of impairment:  all levels Respiration: clavicular breathing Phonation: low vocal intensity Resonance: WFL Articulation: Appears intact Intelligibility: Intelligible Motor planning: Appears intact  ORAL MOTOR  EXAMINATION WFL  STANDARDIZED ASSESSMENTS:   Western Aphasia Battery- Revised  Spontaneous Speech                           Information content               9/10                                            Fluency                                 9/10                                          Comprehension     Yes/No questions                 60/60                                           Auditory Word Recognition  60/60                                Sequential Commands       80/80                              Repetition                             100/100                                        Naming    Object Naming                     60/60                                           Word Fluency                        6/20                                            Sentence Completion          10/10  Responsive Speech              10/10                                         Aphasia Quotient                  93.2/100         Pt's severity rating was mild as indicated by an Aphasia Quotient of 93.2 (0-25=very severe, 26-50=severe, 51-75=moderate, 76 and above is mild). 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   PATIENT REPORTED OUTCOME MEASURES (PROM): To be given in upcoming sessions   TODAY'S TREATMENT:  Pt educated at length re: role of SLP, results of assessment, aphasia and its impact on domains of language as well as linguistically-based cognition, differences between language and cognition, goals of ST, basic principles of neuroplasticity, and SLP POC.   PATIENT EDUCATION: Education details: as above Person educated: Patient Education method: Explanation Education comprehension: verbalized understanding and needs further education  HOME EXERCISE PROGRAM:   To be provided in upcoming sessions    GOALS:  Goals reviewed with patient? Yes  SHORT TERM GOALS: Target date: 10 sessions  Pt will participate  in further assessment of functional cognitive-linguistic ability.  Baseline: Goal status: INITIAL  2.  Pt will completed PROMs re: communication and cognitive-linguistic functioning. Baseline:  Goal status: INITIAL  3.  Pt will utilize compensatory strategies for anomia with min A during structured tasks. Baseline:  Goal status: INITIAL  4.  Pt will write short messages of 3-4 sentences with min A for use of compensatory strategies for spelling and grammar.   Baseline:  Goal status: INITIAL  5.  Pt will demonstrated paragraph level reading comprehension with >80% and min A for use of compensatory strategies. Baseline:  Goal status: INITIAL    LONG TERM GOALS: Target date: 12 weeks; 11/19/23  Pt utilize compensatory strategies for anomia with no more than min cues during conversation based tasks. Baseline:  Goal status: INITIAL  2.   Pt will write paragraph-level materials relating to personal interests, financial and/or medical matters with intact spelling and grammar with >90% accuracy and use of compensatory strategies, as needed.  Baseline:  Goal status: INITIAL  3.  Pt will read paragraph-level materials relating to personal interests, financial, and/or medical matters demonstrating comprehension with >90% accuracy and use of compensatory strategies, as needed. Baseline:  Goal status: INITIAL  4.  Pt will endorse improvement in cognitive-communication per PROM. Baseline:  Goal status: INITIAL  ASSESSMENT:  CLINICAL IMPRESSION: Pt is a 68 y.o. male who presents today for a speech/language evaluation in setting of recent stroke. Assessment completed via functional/dynamic assessment and formal assessment (Western Aphasia Battery-Revised). 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 Pt with wordfinding deficits which are most appreciated in conversational speech, picture description tasks, and divergent naming. Pt with  relatively intact auditory comprehension with pt benefiting from extra time and self correction. Additionally, pt with mildly reduced vocal loudness with limited-to-impact on intelligibility. Impaired functional reading and writing noted. Suspect pt with concomitant cognitive-linguistic deficits and would benefit from further assessment. Recommend course of ST   OBJECTIVE IMPAIRMENTS include receptive language, aphasia, and cognitive-communication . These impairments are limiting patient from return to work, effectively communicating at home and in community, and iADLs . Factors affecting potential  to achieve goals and functional outcome are severity of impairments. Patient will benefit from skilled SLP services to address above impairments and improve overall function.  REHAB POTENTIAL: Good  PLAN: SLP FREQUENCY: 1-2x/week  SLP DURATION: 12 weeks  PLANNED INTERVENTIONS: Environmental controls, Cueing hierachy, Cognitive reorganization, Internal/external aids, Functional tasks, Multimodal communication approach, SLP instruction and feedback, Compensatory strategies, and Patient/family education    Clyde Canterbury, M.S., CCC-SLP Speech-Language Pathologist [facility] [phone] Arnette Felts)   [facility] [address] [place], [place], [address] Phone: [phone]   Fax:  [phone]

## 2023-08-27 NOTE — Therapy (Signed)
OUTPATIENT PHYSICAL THERAPY NEURO TREATMENT   Patient Name: Gordon Garcia MRN: [id number] DOB:Jan 02, 1956, 68 y.o., male Today's Date: 08/27/2023   PCP: Gordon Dane, MD  REFERRING PROVIDER: Carmelina Dane, MD   END OF SESSION:  PT End of Session - 08/27/23 8295     Visit Number --    Number of Visits --    Date for PT Re-Evaluation --    PT Start Time --    PT Stop Time --    PT Time Calculation (min) --    Equipment Utilized During Treatment --    Activity Tolerance --    Behavior During Therapy --             Past Medical History:  Diagnosis Date   Allergic rhinitis    Atrial flutter (HCC)    GERD (gastroesophageal reflux disease)    HTN (hypertension)    Past Surgical History:  Procedure Laterality Date   COLONOSCOPY WITH PROPOFOL N/A 02/14/2021   Procedure: COLONOSCOPY WITH PROPOFOL;  Surgeon: Gordon Minium, MD;  Location: [facility] ENDOSCOPY;  Service: Endoscopy;  Laterality: N/A;   Patient Active Problem List   Diagnosis Date Noted   Atrial fibrillation (HCC) 08/13/2023   Leg cramps 08/13/2023   CVA (cerebral vascular accident) (HCC) 08/10/2023   TIA (transient ischemic attack) 08/10/2023   Hx of colonic polyps    Erectile dysfunction due to diseases classified elsewhere 09/04/2019   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   Allergic rhinitis due to pollen 05/06/2014   Gastroesophageal reflux disease without esophagitis 05/06/2014   Hypertension 05/06/2014   Hypertriglyceridemia 05/06/2014    ONSET DATE: 08/13/2023  REFERRING DIAG:  Diagnosis  I63.9 (ICD-10-CM) - CVA (cerebral vascular accident) (HCC)    THERAPY DIAG:  Unsteadiness on feet  Abnormality of gait  Balance disorder  Other lack of coordination  Rationale for Evaluation and Treatment: Rehabilitation  SUBJECTIVE:                                                                                                                                                                                              SUBJECTIVE STATEMENT: Pt arrived for SLP evaluation. Thought that therapy was over for the day, and left prior to start of PT treatment.     Pt accompanied by: family member sister Gordon Garcia   PERTINENT HISTORY:  Gordon Garcia is a 68 y.o. male with medical history significant for paroxysmal a flutter off anticoagulation, HTN, chronic headache and recent hospitalization for stroke.  - In 08/10/23, patient was hospitalized for stroke with MRI brain that showed old infarcts and acute small vessel infarcts in bilateral basal  ganglia. - He presented initially with TIA symptoms on 1/25, MRI brain was normal and only showed old infarcts - On 1/28 he had similar symptoms, right sided weakness and word finding difficulty, went back to ED, and this time MRI showed acute stroke, 3 small spots in basal ganglia.  - Notably, he stopped taking anticoagulation for 6 months  From Eval: pt reports to PT with new CVA with mild aphasia. States that since CVA, his balance, strength and coordination have been affect. Sister present with pt; questions about the benefits and OT and SLP, and hoping to get referral for additional services.    PAIN:  Are you having pain? No  PRECAUTIONS: None  RED FLAGS: None   WEIGHT BEARING RESTRICTIONS: No  FALLS: Has patient fallen in last 6 months? No  LIVING ENVIRONMENT: Lives with: lives with their spouse Lives in: House/apartment Stairs: Yes: External: 6 steps; none Has following equipment at home: None  PLOF: Independent and Independent with basic ADLs  PATIENT GOALS: improve speech and coordination. 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  DIAGNOSTIC FINDINGS:  MRI 2/30 IMPRESSION: Known acute infarct in the left basal ganglia and corona radiata, more confluent than seen on 08/13/2023 scan but still in the same distribution. No new territory infarct  or interval hemorrhage.  COGNITION: Overall cognitive status: Within functional limits for tasks assessed and new speech impairmments from CVA    SENSATION: WFL  COORDINATION: Mild decreased Speed with ankle to knee on the RLE  Finger to nose: decreased speed on the LLE.   EDEMA:  WFL  MUSCLE TONE:WFL    POSTURE: rounded shoulders and forward head  LOWER EXTREMITY ROM:     Active  Right Eval Left Eval  Hip flexion [facility]  Hip extension    Hip abduction [facility]  Hip adduction [facility]  Hip internal rotation    Hip external rotation    Knee flexion [facility] WFL  Knee extension [facility] WFL  Ankle dorsiflexion    Ankle plantarflexion    Ankle inversion    Ankle eversion     (Blank rows = not tested)  LOWER EXTREMITY MMT:    MMT Right Eval Left Eval  Hip flexion 4+ 4+  Hip extension    Hip abduction 5 5  Hip adduction 4+ 4+  Hip internal rotation    Hip external rotation    Knee flexion 4+ 5  Knee extension 5 5  Ankle dorsiflexion 4+ 4+  Ankle plantarflexion    Ankle inversion    Ankle eversion    (Blank rows = not tested)  BED MOBILITY:  Sit to supine Complete Independence Supine to sit Complete Independence  TRANSFERS: Assistive device utilized: None  Sit to stand: Complete Independence Stand to sit: Complete Independence Chair to chair: Complete Independence Floor: Min A  RAMP:  Level of Assistance: Complete Independence Assistive device utilized: None Ramp Comments:   CURB:  Level of Assistance: CGA Assistive device utilized: None Curb Comments:   STAIRS: Level of Assistance: Modified independence Stair Negotiation Technique: Alternating Pattern  with Single Rail on Right Number of Stairs: 4  Height of Stairs: 6  Comments:   GAIT: Gait pattern: decreased arm swing- Right, decreased arm swing- Left, and decreased stride length Distance walked: 80 Assistive device utilized: None Level of assistance: SBA Comments: decreased step length.  Pt reports feeling "unsteady" at times with  ambulation. No overt deficits noted   FUNCTIONAL TESTS:  5 times sit to stand: 13.59 Timed up and go (TUG): 12.58 6 minute walk test: 1361ft 10 meter walk test: 10.58 and 10.69 Berg Balance Scale: 47 Functional gait assessment: 22      PATIENT SURVEYS:  Stroke Impact Scale 59/80                                                                                                                              TREATMENT DATE: 08/27/2023   No treatment provided. Pt left following SLP evaluation.    PATIENT EDUCATION: Education details: POC, Pt educated throughout session about proper posture and technique with exercises. Improved exercise technique, movement at target joints, use of target muscles after min to mod verbal, visual, tactile cues.  Person educated: Patient and sister Education method: Explanation Education comprehension: verbalized understanding  HOME EXERCISE PROGRAM: Access Code: [email or web address] URL: [email or web address] Date: 08/23/2023 Prepared by: Grier Rocher  Exercises - Tandem Stance with Support  - 1 x daily - 7 x weekly - 3 sets - 6 reps - 20 hold - Single Leg Stance with Support  - 1 x daily - 7 x weekly - 3 sets - 6 reps - 20 hold - Feet Together Balance at [employer] Eyes Closed  - 1 x daily - 7 x weekly - 3 sets - 5 reps - 15 hold - Corner Balance Feet Together: Eyes Closed With Head Turns  - 1 x daily - 7 x weekly - 3 sets - 15 reps - 2 hold  GOALS: Goals reviewed with patient? Yes   SHORT TERM GOALS: Target date: 09/17/2023    Patient will be independent in home exercise program to improve strength/mobility for better functional independence with ADLs. Baseline: to be given at visit 2  Goal status: INITIAL   LONG TERM GOALS: Target date: 10/15/2023    Patient will increase SIS score by equal to or greater than  10 points  to demonstrate statistically significant improvement in mobility and  quality of life.  Baseline: 59 Goal status: INITIAL  2.  Patient (> 57 years old) will increase 6 min walk test by >161ft to indicate improve activity tolerance, and access to community   Baseline: 1325ft Goal status: INITIAL  3.  Patient will increase Berg Balance score by > 6 points to demonstrate decreased fall risk during functional activities Baseline: 47 Goal status: INITIAL  4.  Patient will increase 10 meter walk test to >1.10m/s as to improve gait speed for better community ambulation and to reduce fall risk. Baseline: 0.83m/s Goal status: INITIAL  5.  Patient will reduce timed up and go to <11 seconds to reduce fall risk and demonstrate improved transfer/gait ability. Baseline: 12.58 Goal status: INITIAL  6.  Patient will increase FGA score by 3 points  as to demonstrate reduced fall risk and improved dynamic gait balance for better safety with community/home ambulation.   Baseline:  22 Goal status: INITIAL   ASSESSMENT:  CLINICAL IMPRESSION: Patient is a 68 y.o. male  being seeing for balance and strength deficits. Pt left after SLP Evaluation with misunderstanding of multidisciplinary treatment team.  Will see pt on next scheduled PT treatment.   OBJECTIVE IMPAIRMENTS: Abnormal gait, decreased activity tolerance, decreased balance, decreased coordination, decreased endurance, and decreased knowledge of condition.   ACTIVITY LIMITATIONS: stairs, locomotion level, and caring for others  PARTICIPATION LIMITATIONS: cleaning, interpersonal relationship, driving, shopping, community activity, occupation, and yard work  PERSONAL FACTORS: 1 comorbidity: HTN and paroxysmal a flutter   are also affecting patient's functional outcome.   REHAB POTENTIAL: Excellent  CLINICAL DECISION MAKING: Stable/uncomplicated  EVALUATION COMPLEXITY: Moderate  PLAN:  PT FREQUENCY: 1-2x/week  PT DURATION: 8 weeks  PLANNED INTERVENTIONS: 97110-Therapeutic exercises, 97530- Therapeutic  activity, [id number]- Neuromuscular re-education, 97535- Self Care, 28413- Manual therapy, [phone]- Gait training, Patient/Family education, Balance training, Stair training, Taping, Dry Needling, Joint mobilization, Joint manipulation, DME instructions, Cryotherapy, and Moist heat  PLAN FOR NEXT SESSION:   Dynamic balance training with narrow and single stance. Generalized strengthening   Golden Pop, PT 08/27/2023, 10:05 AM

## 2023-08-27 NOTE — Telephone Encounter (Signed)
Pt was unable to be located following SLP evaluation. Called provided phone number and front desk called Wife's cell phone number. No answer. Left voicemail on pt's phone and with wife asking to reschedule.    Wife called back. York Spaniel that patient will be at Thursday PT appointment.   Grier Rocher PT, DPT  Physical Therapist - [facility]  10:04 AM 08/27/23

## 2023-08-27 NOTE — Therapy (Signed)
OUTPATIENT OCCUPATIONAL THERAPY NEURO EVALUATION  Patient Name: ROLEN CONGER MRN: [id number] DOB:29-May-1956, 68 y.o., male Today's Date: 08/27/2023   REFERRING PROVIDER: Carmelina Dane, MD  END OF SESSION:  OT End of Session - 08/27/23 2308     Visit Number 1    Date for OT Re-Evaluation 10/08/2023    OT Start Time 0808    OT Stop Time 0845    OT Time Calculation (min) 37 min    Activity Tolerance Patient tolerated treatment well    Behavior During Therapy [facility] for tasks assessed/performed             Past Medical History:  Diagnosis Date   Allergic rhinitis    Atrial flutter (HCC)    GERD (gastroesophageal reflux disease)    HTN (hypertension)    Past Surgical History:  Procedure Laterality Date   COLONOSCOPY WITH PROPOFOL N/A 02/14/2021   Procedure: COLONOSCOPY WITH PROPOFOL;  Surgeon: Midge Minium, MD;  Location: [facility] ENDOSCOPY;  Service: Endoscopy;  Laterality: N/A;   Patient Active Problem List   Diagnosis Date Noted   Atrial fibrillation (HCC) 08/13/2023   Leg cramps 08/13/2023   CVA (cerebral vascular accident) (HCC) 08/10/2023   TIA (transient ischemic attack) 08/10/2023   Hx of colonic polyps    Erectile dysfunction due to diseases classified elsewhere 09/04/2019   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   Allergic rhinitis due to pollen 05/06/2014   Gastroesophageal reflux disease without esophagitis 05/06/2014   Hypertension 05/06/2014   Hypertriglyceridemia 05/06/2014    ONSET DATE: 08/13/2023  REFERRING DIAG: CVA  THERAPY DIAG:  Muscle weakness (generalized)  Other lack of coordination  Rationale for Evaluation and Treatment: Rehabilitation  SUBJECTIVE:   SUBJECTIVE STATEMENT: Pt. reports doing okay today. Pt. reports not being sure he needs OT. Pt accompanied by: self  PERTINENT HISTORY: Pt. Is a 68 y.o male who was diagnosed with a CVA-Acute Bilateral Basal Ganglia Infarcts, and hospitalized from 08/13/23 to 08/14/23.   PRECAUTIONS: None  WEIGHT  BEARING RESTRICTIONS: No  PAIN:  Are you having pain? No  FALLS: Has patient fallen in last 6 months? No  LIVING ENVIRONMENT: Lives with: lives with their spouse Lives in: House/apartment Stairs: 6 external-one story Has following equipment at home: Grab bars, [facility]  PLOF: Independent  PATIENT GOALS: Speech  OBJECTIVE:  Note: Objective measures were completed at Evaluation unless otherwise noted.  HAND DOMINANCE: Right  ADLs:  Transfers/ambulation related to ADLs: Independent Eating: Coordination with  Grooming: minimal difficulty setting up toothpaste on the toothbrush UB Dressing: Independent LB Dressing: Independent Toileting: independent Bathing: Independent Tub Shower transfers: Modified Independent   IADLs: Shopping: has mot performed  Light housekeeping: Independent Meal Prep: Independent Community mobility:  Relies on family Medication management: Wife sets up Mining engineer management: Wife is performing  Handwriting: 50% legible for name only Works in Theatre stage manager  MOBILITY STATUS: Independent  ACTIVITY TOLERANCE: Activity tolerance: Tolerated >30 min. Of activity  FUNCTIONAL OUTCOME MEASURES:   UPPER EXTREMITY ROM:    Active ROM Right Eval WFL Left Eval [facility]  Shoulder flexion    Shoulder abduction    Shoulder adduction    Shoulder extension    Shoulder internal rotation    Shoulder external rotation    Elbow flexion    Elbow extension    Wrist flexion    Wrist extension    Wrist ulnar deviation    Wrist radial deviation    Wrist pronation    Wrist supination    (Blank rows =  not tested)  UPPER EXTREMITY MMT:     MMT Right Eval 5/5 Left Eval 5/5  Shoulder flexion    Shoulder abduction    Shoulder adduction    Shoulder extension    Shoulder internal rotation    Shoulder external rotation    Middle trapezius    Lower trapezius    Elbow flexion    Elbow extension    Wrist flexion    Wrist extension    Wrist ulnar  deviation    Wrist radial deviation    Wrist pronation    Wrist supination    (Blank rows = not tested)  HAND FUNCTION: Grip strength: Right: 54 lbs; Left: 73 lbs, Lateral pinch: Right: 23 lbs, Left: 31 lbs, and 3 point pinch: Right: 20 lbs, Left: 24 lbs  COORDINATION: 9 Hole Peg test: Right: 29 sec; Left: 18 sec  SENSATION: WFL light touch, and proprioception  EDEMA: N/A  MUSCLE TONE: WFL  COGNITION: Changes with Speech, navigating computer and cell phone, sequencing TV remote use  VISION: No change form baseline. Wears glasses for reading  PERCEPTION: WFL for tasks performed  PRAXIS: [facility] for tasks performed                                                                                                                             TREATMENT DATE:   Evaluation completed, and Pt. education was provided. See below.   PATIENT EDUCATION: Education details: OT services, POC, goals Pt. ADL /IADL functional status, Person educated: Patient Education method: Explanation Education comprehension: verbalized understanding, verbal cues required, and needs further education  HOME EXERCISE PROGRAM: Assess need for, and provide as indicated    GOALS: Goals reviewed with patient? Yes  SHORT TERM GOALS: Target date: 09/17/2023    Pt. Will be independent with HEPs for the RUE. Baseline: Eval: No current HEP Goal status: INITIAL   LONG TERM GOALS: Target date: 10/08/2023  Pt. Will increase right grip strength by 10# to be able to  securely hold items in his hand Baseline: Eval: R: 54#, L: 73# Goal status: INITIAL  2.  Pt. Will improve right pinch strength by 3# in order to be able to securely hold cell phone, or remote Baseline: Eval: Lateral pinch: R: 23#, L: 31#, 3pt. Pinch: R: 20#, L: 24# Goal status: INITIAL  3.  Pt. Will improve right [facility] skills by   sec. of speed in order to be able to manipulate small objects.  Baseline: Eval: R: 29 sec., L: 18 sec. Goal status:  INITIAL  4.  Pt. Will write his name with 75% legibility Baseline: Eval: 50% legibility for name only Goal status: INITIAL  5.  Pt. Will independently, and efficiently type a sentence in preparation for typing an email correspondence Baseline: Eval: Pt. has difficulty with typing  Goal status: INITIAL  6. Pt. Will navigate through a cell phone/computer to access information on the internet 75% of the time. Baseline: Eval:  Pt. has difficulty with typing  Goal status: INITIAL     ASSESSMENT:  CLINICAL IMPRESSION:  Patient is a 68 y.o. male who was seen today for occupational therapy evaluation for CVA with dominant right sided weakness.  Pt. Presents with decreased right grip strength, pinch strength, and [facility] skills which hinders his ability to securely hold items, and efficiently manipulate small objects during ADLs/IADL tasks, and write legibly. Pt. has difficulty writing legibly, typing, and navigating through cell phones and the computer to access information on the internet. Pt. Reports having difficulty entering passwords, as well as sequencing through the order of clicks when using a television remote. Pt. Will benefit from OT services to work on improving dominant RUE functioning in order to to increase engagement in, and success with ADLs, and IADL tasks.  PERFORMANCE DEFICITS: in functional skills including ADLs, IADLs, coordination, dexterity, ROM, strength, Fine motor control, Gross motor control, and UE functional use, cognitive skills including problem solving and sequencing, and psychosocial skills including coping strategies and environmental adaptation.   IMPAIRMENTS: are limiting patient from ADLs, IADLs, and leisure.   CO-MORBIDITIES: may have co-morbidities  that affects occupational performance. Patient will benefit from skilled OT to address above impairments and improve overall function.  MODIFICATION OR ASSISTANCE TO COMPLETE EVALUATION: Min-Moderate modification of  tasks or assist with assess necessary to complete an evaluation.  OT OCCUPATIONAL PROFILE AND HISTORY: Detailed assessment: Review of records and additional review of physical, cognitive, psychosocial history related to current functional performance.  CLINICAL DECISION MAKING: Moderate - several treatment options, min-mod task modification necessary  REHAB POTENTIAL: Good  EVALUATION COMPLEXITY: Moderate    PLAN:  OT FREQUENCY: 2x/week  OT DURATION: 6 weeks  PLANNED INTERVENTIONS: 97168 OT Re-evaluation, 97535 self care/ADL training, 16109 therapeutic exercise, 97530 therapeutic activity, 97112 neuromuscular re-education, 97140 manual therapy, 97018 paraffin, 60454 moist heat, 97034 contrast bath, coping strategies training, patient/family education, and DME and/or AE instructions  RECOMMENDED OTHER SERVICES: ST, PT  CONSULTED AND AGREED WITH PLAN OF CARE: Patient  PLAN FOR NEXT SESSION: Initiate Treatment   Olegario Messier, MS, OTR/L 08/27/2023, 11:12 PM

## 2023-08-29 ENCOUNTER — Ambulatory Visit: Payer: Managed Care, Other (non HMO)

## 2023-08-29 ENCOUNTER — Ambulatory Visit: Payer: Managed Care, Other (non HMO) | Admitting: Physical Therapy

## 2023-08-29 DIAGNOSIS — R278 Other lack of coordination: Secondary | ICD-10-CM

## 2023-08-29 DIAGNOSIS — M6281 Muscle weakness (generalized): Secondary | ICD-10-CM

## 2023-08-29 DIAGNOSIS — R2689 Other abnormalities of gait and mobility: Secondary | ICD-10-CM

## 2023-08-29 DIAGNOSIS — R41841 Cognitive communication deficit: Secondary | ICD-10-CM

## 2023-08-29 DIAGNOSIS — R2681 Unsteadiness on feet: Secondary | ICD-10-CM

## 2023-08-29 DIAGNOSIS — R4701 Aphasia: Secondary | ICD-10-CM

## 2023-08-29 DIAGNOSIS — R269 Unspecified abnormalities of gait and mobility: Secondary | ICD-10-CM

## 2023-08-29 DIAGNOSIS — R471 Dysarthria and anarthria: Secondary | ICD-10-CM

## 2023-08-29 NOTE — Therapy (Signed)
OUTPATIENT PHYSICAL THERAPY NEURO TREATMENT   Patient Name: Gordon Garcia MRN: [id number] DOB:02-29-1956, 68 y.o., male Today's Date: 08/29/2023   PCP: Carmelina Dane, MD  REFERRING PROVIDER: Carmelina Dane, MD   END OF SESSION:  PT End of Session - 08/29/23 0846     Visit Number 3    Number of Visits 16    Date for PT Re-Evaluation 10/15/23    Progress Note Due on Visit 10    PT Start Time 0846    PT Stop Time 0925    PT Time Calculation (min) 39 min    Equipment Utilized During Treatment Gait belt    Activity Tolerance Patient tolerated treatment well    Behavior During Therapy WFL for tasks assessed/performed             Past Medical History:  Diagnosis Date   Allergic rhinitis    Atrial flutter (HCC)    GERD (gastroesophageal reflux disease)    HTN (hypertension)    Past Surgical History:  Procedure Laterality Date   COLONOSCOPY WITH PROPOFOL N/A 02/14/2021   Procedure: COLONOSCOPY WITH PROPOFOL;  Surgeon: Midge Minium, MD;  Location: [facility] ENDOSCOPY;  Service: Endoscopy;  Laterality: N/A;   Patient Active Problem List   Diagnosis Date Noted   Atrial fibrillation (HCC) 08/13/2023   Leg cramps 08/13/2023   CVA (cerebral vascular accident) (HCC) 08/10/2023   TIA (transient ischemic attack) 08/10/2023   Hx of colonic polyps    Erectile dysfunction due to diseases classified elsewhere 09/04/2019   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   Allergic rhinitis due to pollen 05/06/2014   Gastroesophageal reflux disease without esophagitis 05/06/2014   Hypertension 05/06/2014   Hypertriglyceridemia 05/06/2014    ONSET DATE: 08/13/2023  REFERRING DIAG:  Diagnosis  I63.9 (ICD-10-CM) - CVA (cerebral vascular accident) (HCC)    THERAPY DIAG:  Muscle weakness (generalized)  Unsteadiness on feet  Abnormality of gait  Balance disorder  Rationale for Evaluation and Treatment: Rehabilitation  SUBJECTIVE:                                                                                                                                                                                              SUBJECTIVE STATEMENT: Pt reports doing well today. Pt denies any recent falls/stumbles since prior session. Pt denies any updates to medications or medical appointment since prior session. Pt reports good compliance with HEP when time permits.      Pt accompanied by: family member sister Reita Cliche   PERTINENT HISTORY:  Gordon Garcia is a 68 y.o. male with medical history significant for paroxysmal a flutter off anticoagulation, HTN, chronic  headache and recent hospitalization for stroke.  - In 08/10/23, patient was hospitalized for stroke with MRI brain that showed old infarcts and acute small vessel infarcts in bilateral basal ganglia. - He presented initially with TIA symptoms on 1/25, MRI brain was normal and only showed old infarcts - On 1/28 he had similar symptoms, right sided weakness and word finding difficulty, went back to ED, and this time MRI showed acute stroke, 3 small spots in basal ganglia.  - Notably, he stopped taking anticoagulation for 6 months  From Eval: pt reports to PT with new CVA with mild aphasia. States that since CVA, his balance, strength and coordination have been affect. Sister present with pt; questions about the benefits and OT and SLP, and hoping to get referral for additional services.    PAIN:  Are you having pain? No  PRECAUTIONS: None  RED FLAGS: None   WEIGHT BEARING RESTRICTIONS: No  FALLS: Has patient fallen in last 6 months? No  LIVING ENVIRONMENT: Lives with: lives with their spouse Lives in: House/apartment Stairs: Yes: External: 6 steps; none Has following equipment at home: None  PLOF: Independent and Independent with basic ADLs  PATIENT GOALS: improve speech and coordination. 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  DIAGNOSTIC FINDINGS:  MRI 2/30 IMPRESSION: Known acute infarct in the left basal ganglia and corona radiata, more confluent than seen on 08/13/2023 scan but still in the same distribution. No new territory infarct or interval hemorrhage.  COGNITION: Overall cognitive status: Within functional limits for tasks assessed and new speech impairmments from CVA    SENSATION: WFL  COORDINATION: Mild decreased Speed with ankle to knee on the RLE  Finger to nose: decreased speed on the LLE.   EDEMA:  WFL  MUSCLE TONE:WFL    POSTURE: rounded shoulders and forward head  LOWER EXTREMITY ROM:     Active  Right Eval Left Eval  Hip flexion [facility]  Hip extension    Hip abduction [facility]  Hip adduction [facility]  Hip internal rotation    Hip external rotation    Knee flexion [facility] WFL  Knee extension [facility] WFL  Ankle dorsiflexion    Ankle plantarflexion    Ankle inversion    Ankle eversion     (Blank rows = not tested)  LOWER EXTREMITY MMT:    MMT Right Eval Left Eval  Hip flexion 4+ 4+  Hip extension    Hip abduction 5 5  Hip adduction 4+ 4+  Hip internal rotation    Hip external rotation    Knee flexion 4+ 5  Knee extension 5 5  Ankle dorsiflexion 4+ 4+  Ankle plantarflexion    Ankle inversion    Ankle eversion    (Blank rows = not tested)  BED MOBILITY:  Sit to supine Complete Independence Supine to sit Complete Independence  TRANSFERS: Assistive device utilized: None  Sit to stand: Complete Independence Stand to sit: Complete Independence Chair to chair: Complete Independence Floor: Min A  RAMP:  Level of Assistance: Complete Independence Assistive device utilized: None Ramp Comments:   CURB:  Level of Assistance: CGA Assistive device utilized: None Curb Comments:   STAIRS: Level of Assistance: Modified independence Stair Negotiation Technique: Alternating Pattern  with Single Rail on Right Number of Stairs: 4  Height of Stairs:  6  Comments:   GAIT: Gait pattern: decreased arm swing- Right,  decreased arm swing- Left, and decreased stride length Distance walked: 80 Assistive device utilized: None Level of assistance: SBA Comments: decreased step length. Pt reports feeling "unsteady" at times with ambulation. No overt deficits noted   FUNCTIONAL TESTS:  5 times sit to stand: 13.59 Timed up and go (TUG): 12.58 6 minute walk test: 1359ft 10 meter walk test: 10.58 and 10.69 Berg Balance Scale: 47 Functional gait assessment: 22      PATIENT SURVEYS:  Stroke Impact Scale 59/80                                                                                                                              TREATMENT DATE: 08/29/2023   TA- To improve functional movements patterns for everyday tasks  Nustep level 3x 3 min  and level 6 x 3 min for aerobic priming and endurance training x 6 min   Exercise/Activity Sets/ Reps/Time/ Resistance Assistance Charge type Comments  NBOS on airex with head turns  3 x 45 sec  Neuro re-ed   tandem on airex  3 x 45 sec ( LLE post)   Neuro re-ed   Marching no UE support, slow and controlled  10 x each with 3 sec hold  Neuro re-ed   Hip abduction no UE with slow and controlled movements  *10 ea  Neuro re-ed   Airex step up 10 x ea lateral and ant/ post   Neuro re-ed   SLS with ball toss to wall 3 x 10 ea LE  TA Challenging coordination and balance, difficulty with power control on R UE     Airex stance with basketball shot x 40-50 shots working on balance, coordination and UE control and power    TA Challenging coordination and balance, difficulty with power control on R UE                           Treatment provided this session   Pt educated throughout session about proper posture and technique with exercises. Improved exercise technique, movement at target joints, use of target muscles after min to mod verbal, visual, tactile cues. Note: Portions of this document were  prepared using Dragon voice recognition software and although reviewed may contain unintentional dictation errors in syntax, grammar, or spelling.      PATIENT EDUCATION: Education details: POC, Pt educated throughout session about proper posture and technique with exercises. Improved exercise technique, movement at target joints, use of target muscles after min to mod verbal, visual, tactile cues.  Person educated: Patient and sister Education method: Explanation Education comprehension: verbalized understanding  HOME EXERCISE PROGRAM: Access Code: [email or web address] URL: [email or web address] Date: 08/23/2023 Prepared by: Grier Rocher  Exercises - Tandem Stance with Support  - 1 x daily - 7 x weekly - 3 sets - 6 reps - 20 hold - Single Leg Stance with Support  - 1 x daily - 7 x weekly - 3  sets - 6 reps - 20 hold - Feet Together Balance at [employer] Eyes Closed  - 1 x daily - 7 x weekly - 3 sets - 5 reps - 15 hold - Corner Balance Feet Together: Eyes Closed With Head Turns  - 1 x daily - 7 x weekly - 3 sets - 15 reps - 2 hold  GOALS: Goals reviewed with patient? Yes   SHORT TERM GOALS: Target date: 09/17/2023    Patient will be independent in home exercise program to improve strength/mobility for better functional independence with ADLs. Baseline: to be given at visit 2  Goal status: INITIAL   LONG TERM GOALS: Target date: 10/15/2023    Patient will increase SIS score by equal to or greater than  10 points  to demonstrate statistically significant improvement in mobility and quality of life.  Baseline: 59 Goal status: INITIAL  2.  Patient (> 75 years old) will increase 6 min walk test by >181ft to indicate improve activity tolerance, and access to community   Baseline: 1330ft Goal status: INITIAL  3.  Patient will increase Berg Balance score by > 6 points to demonstrate decreased fall risk during functional activities Baseline: 47 Goal status: INITIAL  4.   Patient will increase 10 meter walk test to >1.81m/s as to improve gait speed for better community ambulation and to reduce fall risk. Baseline: 0.55m/s Goal status: INITIAL  5.  Patient will reduce timed up and go to <11 seconds to reduce fall risk and demonstrate improved transfer/gait ability. Baseline: 12.58 Goal status: INITIAL  6.  Patient will increase FGA score by 3 points  as to demonstrate reduced fall risk and improved dynamic gait balance for better safety with community/home ambulation.   Baseline: 22 Goal status: INITIAL   ASSESSMENT:  CLINICAL IMPRESSION: Patient presents with good motivation for completion of physical therapy activities.  Patient demonstrating good response to higher-level balance activities but did demonstrate some difficulty with right upper extremity coordination and strength with any kind of ball toss activity.  Patient also challenged with dynamic single-leg stance activities this date.  Patient working on home exercise program reports that has been going well. Pt will continue to benefit from skilled physical therapy intervention to address impairments, improve QOL, and attain therapy goals.    OBJECTIVE IMPAIRMENTS: Abnormal gait, decreased activity tolerance, decreased balance, decreased coordination, decreased endurance, and decreased knowledge of condition.   ACTIVITY LIMITATIONS: stairs, locomotion level, and caring for others  PARTICIPATION LIMITATIONS: cleaning, interpersonal relationship, driving, shopping, community activity, occupation, and yard work  PERSONAL FACTORS: 1 comorbidity: HTN and paroxysmal a flutter   are also affecting patient's functional outcome.   REHAB POTENTIAL: Excellent  CLINICAL DECISION MAKING: Stable/uncomplicated  EVALUATION COMPLEXITY: Moderate  PLAN:  PT FREQUENCY: 1-2x/week  PT DURATION: 8 weeks  PLANNED INTERVENTIONS: 97110-Therapeutic exercises, 97530- Therapeutic activity, [id number]- Neuromuscular  re-education, 97535- Self Care, 16109- Manual therapy, [phone]- Gait training, Patient/Family education, Balance training, Stair training, Taping, Dry Needling, Joint mobilization, Joint manipulation, DME instructions, Cryotherapy, and Moist heat  PLAN FOR NEXT SESSION:   Dynamic balance training with narrow and single stance. Generalized strengthening   Norman Herrlich, PT 08/29/2023, 8:49 AM

## 2023-08-29 NOTE — Therapy (Signed)
OUTPATIENT SPEECH LANGUAGE PATHOLOGY COGNITIVE-LINGUISTIC EVAL AND TREAT   Patient Name: Gordon Garcia MRN: [id number] DOB:[birth year], 68 y.o., male Today's Date: 08/29/2023  PCP: Carmelina Dane, MD REFERRING PROVIDER: as above   End of Session - 08/29/23 1208     Visit Number 2    Number of Visits 24    Date for SLP Re-Evaluation 11/19/23    SLP Start Time 0805    SLP Stop Time  0930    SLP Time Calculation (min) 85 min    Activity Tolerance Patient tolerated treatment well             Past Medical History:  Diagnosis Date   Allergic rhinitis    Atrial flutter (HCC)    GERD (gastroesophageal reflux disease)    HTN (hypertension)    Past Surgical History:  Procedure Laterality Date   COLONOSCOPY WITH PROPOFOL N/A 02/14/2021   Procedure: COLONOSCOPY WITH PROPOFOL;  Surgeon: Midge Minium, MD;  Location: [facility] ENDOSCOPY;  Service: Endoscopy;  Laterality: N/A;   Patient Active Problem List   Diagnosis Date Noted   Atrial fibrillation (HCC) 08/13/2023   Leg cramps 08/13/2023   CVA (cerebral vascular accident) (HCC) 08/10/2023   TIA (transient ischemic attack) 08/10/2023   Hx of colonic polyps    Erectile dysfunction due to diseases classified elsewhere 09/04/2019   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   Allergic rhinitis due to pollen 05/06/2014   Gastroesophageal reflux disease without esophagitis 05/06/2014   Hypertension 05/06/2014   Hypertriglyceridemia 05/06/2014    ONSET DATE: 08/10/23 (CVA);   REFERRING DIAG: 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 Pt accompanied by: self  PERTINENT HISTORY: Gordon Garcia is a 68 y.o. male with medical history significant for paroxysmal a flutter off anticoagulation, HTN, chronic headache and recent hospitalization  for stroke.  - In 08/10/23, patient was hospitalized for stroke with MRI brain that showed old infarcts and acute small vessel infarcts in bilateral basal ganglia. - He presented initially with TIA symptoms on 1/25, MRI brain was normal and only showed old infarcts - On 1/28 he had similar symptoms, right sided weakness and word finding difficulty, went back to ED, and this time MRI showed acute stroke, 3 small spots in basal ganglia.  - Notably, he stopped taking anticoagulation for 6 months    PAIN:  Are you having pain? No  FALLS: Has patient fallen in last 6 months?  No  LIVING ENVIRONMENT: Lives with: lives with their spouse Lives in: House/apartment  PLOF:  Level of assistance: Independent with ADLs, Independent with IADLs Employment: Full-time employment prior to CVA   PATIENT GOALS    for speech and cognition to improve   OBJECTIVE:  Cognitive Linguistic Quick Test: AGE - 18 - 69   The Cognitive Linguistic Quick Test (CLQT) was administered to assess the relative status of five cognitive domains: attention, memory, language, executive functioning, and visuospatial skills. Scores from 10 tasks were used to estimate severity ratings (standardized for age groups 18-69 years and 70-89 years) for each domain, a clock drawing task, as well as an overall composite severity rating of cognition.       Task Score Criterion Cut Scores  Personal Facts 8/8 8  Symbol Cancellation 11/12 11  Confrontation Naming 10/10 10  Clock Drawing  8/13 12  Story Retelling 6/10  6  Symbol Trails 7/10 9  Generative Naming 4/9 5  Design Memory 6/6 5  Mazes  6/8 7  Design Generation 8/13 6    Cognitive Domain Composite Score Severity Rating  Attention 176/215 Mild  Memory 156/185 Mild  Executive Function 25/40 WNL  Language 28/37 Mild  Visuospatial Skills 86/105 Mild  Clock Drawing  8/13 Moderate  Composite Severity Rating  Mild    TODAY'S TREATMENT:  Pt educated at length re: results  of assessment, aphasia and its impact on domains of language as well as linguistically-based cognition, differences between language and cognition, goals of ST, basic principles of neuroplasticity, and SLP POC. Pt identified cognitive-linguistic difficulty with ordering an item over the telephone. Pt and SLP discussed breakdown in cognitive-linguistic functioning. Supportive counseling provided accordingly.   PATIENT EDUCATION: Education details: as above Person educated: Patient Education method: Explanation Education comprehension: verbalized understanding and needs further education  HOME EXERCISE PROGRAM:   To be provided in upcoming sessions    GOALS:  Goals reviewed with patient? Yes  SHORT TERM GOALS: Target date: 10 sessions  Pt will participate in further assessment of functional cognitive-linguistic ability.  Baseline: Goal status: MET  2.  Pt will completed PROMs re: communication and cognitive-linguistic functioning. Baseline:  Goal status: INITIAL  3.  Pt will utilize compensatory strategies for anomia with min A during structured tasks. Baseline:  Goal status: INITIAL  4.  Pt will write short messages of 3-4 sentences with min A for use of compensatory strategies for spelling and grammar.   Baseline:  Goal status: INITIAL  5.  Pt will demonstrated paragraph level reading comprehension with >80% and min A for use of compensatory strategies. Baseline:  Goal status: INITIAL    LONG TERM GOALS: Target date: 12 weeks; 11/19/23  Pt utilize compensatory strategies for anomia with no more than min cues during conversation based tasks. Baseline:  Goal status: INITIAL  2.   Pt will write paragraph-level materials relating to personal interests, financial and/or medical matters with intact spelling and grammar with >90% accuracy and use of compensatory strategies, as needed.  Baseline:  Goal status: INITIAL  3.  Pt will read paragraph-level materials relating to  personal interests, financial, and/or medical matters demonstrating comprehension with >90% accuracy and use of compensatory strategies, as needed. Baseline:  Goal status: INITIAL  4.  Pt will endorse improvement in cognitive-communication per PROM. Baseline:  Goal status: INITIAL  ASSESSMENT:  CLINICAL IMPRESSION: Pt is a 68 y.o. male who presents today for a speech/language evaluation in setting of recent stroke. Initial SLP assessment completed via functional/dynamic assessment and formal assessment (Western Aphasia Battery-Revised). 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 Pt with wordfinding deficits which are most appreciated in conversational speech, picture description tasks, and divergent naming. Pt with relatively intact auditory comprehension with pt benefiting from extra time and self correction. Additionally, pt with mildly reduced vocal loudness with limited-to-impact on intelligibility. Impaired functional reading and writing noted. During today's tx, cognitive-linguistic evaluation via Cognitive Linguistic Quick Test. Pt presents with mild cognitive-linguistic deficits affecting attention, memory, language, and visuospatial skills. Recommend course of ST   OBJECTIVE IMPAIRMENTS include receptive language, aphasia, and cognitive-communication . These impairments are limiting patient from return to work, effectively communicating at home and in community, and iADLs . Factors affecting potential to achieve goals and functional outcome are severity of impairments. Patient will benefit from skilled SLP services to address above impairments and improve overall  function.  REHAB POTENTIAL: Good  PLAN: SLP FREQUENCY: 1-2x/week  SLP DURATION: 12 weeks  PLANNED INTERVENTIONS: Environmental controls, Cueing hierachy, Cognitive reorganization, Internal/external aids, Functional tasks, Multimodal communication approach, SLP instruction  and feedback, Compensatory strategies, and Patient/family education    Clyde Canterbury, M.S., CCC-SLP Speech-Language Pathologist [place] [facility] [phone] Arnette Felts)  [facility] [address] [place], [place], [address] Phone: [phone]   Fax:  [phone]

## 2023-08-31 NOTE — Therapy (Signed)
OUTPATIENT OCCUPATIONAL THERAPY NEURO TREATMENT NOTE  Patient Name: Gordon Garcia MRN: [id number] DOB:June 04, 1956, 68 y.o., male Today's Date: 08/31/2023   REFERRING PROVIDER: Carmelina Dane, MD  END OF SESSION:   OT End of Session - 08/31/23 1041     Visit Number 2    Number of Visits 12    Date for OT Re-Evaluation 10/08/23    OT Start Time 0930    OT Stop Time 1015    OT Time Calculation (min) 45 min    Activity Tolerance Patient tolerated treatment well    Behavior During Therapy WFL for tasks assessed/performed               Past Medical History:  Diagnosis Date   Allergic rhinitis    Atrial flutter (HCC)    GERD (gastroesophageal reflux disease)    HTN (hypertension)    Past Surgical History:  Procedure Laterality Date   COLONOSCOPY WITH PROPOFOL N/A 02/14/2021   Procedure: COLONOSCOPY WITH PROPOFOL;  Surgeon: Midge Minium, MD;  Location: [facility] ENDOSCOPY;  Service: Endoscopy;  Laterality: N/A;   Patient Active Problem List   Diagnosis Date Noted   Atrial fibrillation (HCC) 08/13/2023   Leg cramps 08/13/2023   CVA (cerebral vascular accident) (HCC) 08/10/2023   TIA (transient ischemic attack) 08/10/2023   Hx of colonic polyps    Erectile dysfunction due to diseases classified elsewhere 09/04/2019   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   Allergic rhinitis due to pollen 05/06/2014   Gastroesophageal reflux disease without esophagitis 05/06/2014   Hypertension 05/06/2014   Hypertriglyceridemia 05/06/2014    ONSET DATE: 08/13/2023  REFERRING DIAG: CVA  THERAPY DIAG:  Muscle weakness (generalized)  Other lack of coordination  Rationale for Evaluation and Treatment: Rehabilitation  SUBJECTIVE:  SUBJECTIVE STATEMENT: Pt reports that he's planning to retire because he won't be able to keep up at work. Pt accompanied by: self  PERTINENT HISTORY: Pt. Is a 68 y.o male who was diagnosed with a CVA-Acute Bilateral Basal Ganglia Infarcts, and hospitalized from 08/13/23  to 08/14/23.   PRECAUTIONS: None  WEIGHT BEARING RESTRICTIONS: No  PAIN:  Are you having pain? No  FALLS: Has patient fallen in last 6 months? No  LIVING ENVIRONMENT: Lives with: lives with their spouse Lives in: House/apartment Stairs: 6 external-one story Has following equipment at home: Grab bars, [facility]  PLOF: Independent  PATIENT GOALS: Speech  OBJECTIVE:  Note: Objective measures were completed at Evaluation unless otherwise noted.  HAND DOMINANCE: Right  ADLs:  Transfers/ambulation related to ADLs: Independent Eating: Coordination with  Grooming: minimal difficulty setting up toothpaste on the toothbrush UB Dressing: Independent LB Dressing: Independent Toileting: independent Bathing: Independent Tub Shower transfers: Modified Independent   IADLs: Shopping: has mot performed  Light housekeeping: Independent Meal Prep: Independent Community mobility:  Relies on family Medication management: Wife sets up Mining engineer management: Wife is performing  Handwriting: 50% legible for name only Works in Theatre stage manager  MOBILITY STATUS: Independent  ACTIVITY TOLERANCE: Activity tolerance: Tolerated >30 min. Of activity  FUNCTIONAL OUTCOME MEASURES:   UPPER EXTREMITY ROM:    Active ROM Right Eval WFL Left Eval [facility]  Shoulder flexion    Shoulder abduction    Shoulder adduction    Shoulder extension    Shoulder internal rotation    Shoulder external rotation    Elbow flexion    Elbow extension    Wrist flexion    Wrist extension    Wrist ulnar deviation    Wrist radial deviation  Wrist pronation    Wrist supination    (Blank rows = not tested)  UPPER EXTREMITY MMT:     MMT Right Eval 5/5 Left Eval 5/5  Shoulder flexion    Shoulder abduction    Shoulder adduction    Shoulder extension    Shoulder internal rotation    Shoulder external rotation    Middle trapezius    Lower trapezius    Elbow flexion    Elbow extension    Wrist  flexion    Wrist extension    Wrist ulnar deviation    Wrist radial deviation    Wrist pronation    Wrist supination    (Blank rows = not tested)  HAND FUNCTION: Grip strength: Right: 54 lbs; Left: 73 lbs, Lateral pinch: Right: 23 lbs, Left: 31 lbs, and 3 point pinch: Right: 20 lbs, Left: 24 lbs  COORDINATION: 9 Hole Peg test: Right: 29 sec; Left: 18 sec  SENSATION: WFL light touch, and proprioception  EDEMA: N/A  MUSCLE TONE: WFL  COGNITION: Changes with Speech, navigating computer and cell phone, sequencing TV remote use  VISION: No change form baseline. Wears glasses for reading  PERCEPTION: WFL for tasks performed  PRAXIS: [facility] for tasks performed                                                                                                     TREATMENT DATE: 08/29/23 Therapeutic Exercise: Issued strong blue theraputty and instructed pt in strengthening and coordination exercises for R hand, including gross grasping, lateral/2 point/3 point pinching, digit abd/add, and digging coins out of putty.  Able to return demo with intermittent vc for technique to improve quality of movement.  Encouraged completion 5-10 min, 1-2x per day.    Self Care: -R hand only and R/L typing practice; short words, quick alternating letters on R hand, short sentences.  -Handwriting practice: reps of printed name, signature, date, short sentences  PATIENT EDUCATION: Education details: HEP initiation: handwriting and typing daily  Person educated: Patient Education method: Explanation Education comprehension: verbalized understanding  HOME EXERCISE PROGRAM: Daily handwriting and typing practice  GOALS: Goals reviewed with patient? Yes  SHORT TERM GOALS: Target date: 09/17/2023    Pt. Will be independent with HEPs for the RUE. Baseline: Eval: No current HEP Goal status: INITIAL   LONG TERM GOALS: Target date: 10/08/2023  Pt. Will increase right grip strength by 10# to be able to   securely hold items in his hand Baseline: Eval: R: 54#, L: 73# Goal status: INITIAL  2.  Pt. Will improve right pinch strength by 3# in order to be able to securely hold cell phone, or remote Baseline: Eval: Lateral pinch: R: 23#, L: 31#, 3pt. Pinch: R: 20#, L: 24# Goal status: INITIAL  3.  Pt. Will improve right [facility] skills by   sec. of speed in order to be able to manipulate small objects.  Baseline: Eval: R: 29 sec., L: 18 sec. Goal status: INITIAL  4.  Pt. Will write his name with 75% legibility Baseline: Eval: 50% legibility for name only  Goal status: INITIAL  5.  Pt. Will independently, and efficiently type a sentence in preparation for typing an email correspondence Baseline: Eval: Pt. has difficulty with typing  Goal status: INITIAL  6. Pt. Will navigate through a cell phone/computer to access information on the internet 75% of the time. Baseline: Eval: Pt. has difficulty with typing  Goal status: INITIAL     ASSESSMENT:  CLINICAL IMPRESSION: Good ability to follow visual handout for theraputty exercises; pt will begin these exercises at home. Completed handwriting and typing practice this date; font increased on computer as pt had not brought his glasses.  OT encouraged pt to bring glasses each session; pt agreed.  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  Will continue to assess.  OT encouraged pt engage in daily handwriting and typing practice, with recommendation for sending kids short emails, and writing name/signature/short sentences.  Pt agreed.  Pt will continue to benefit from OT services to work on improving dominant RUE functioning in order to to increase engagement in, and success with ADLs, and IADL tasks.  PERFORMANCE DEFICITS: in functional skills including ADLs, IADLs,  coordination, dexterity, ROM, strength, Fine motor control, Gross motor control, and UE functional use, cognitive skills including problem solving and sequencing, and psychosocial skills including coping strategies and environmental adaptation.   IMPAIRMENTS: are limiting patient from ADLs, IADLs, and leisure.   CO-MORBIDITIES: may have co-morbidities  that affects occupational performance. Patient will benefit from skilled OT to address above impairments and improve overall function.  MODIFICATION OR ASSISTANCE TO COMPLETE EVALUATION: Min-Moderate modification of tasks or assist with assess necessary to complete an evaluation.  OT OCCUPATIONAL PROFILE AND HISTORY: Detailed assessment: Review of records and additional review of physical, cognitive, psychosocial history related to current functional performance.  CLINICAL DECISION MAKING: Moderate - several treatment options, min-mod task modification necessary  REHAB POTENTIAL: Good  EVALUATION COMPLEXITY: Moderate    PLAN:  OT FREQUENCY: 2x/week  OT DURATION: 6 weeks  PLANNED INTERVENTIONS: 97168 OT Re-evaluation, 97535 self care/ADL training, 69629 therapeutic exercise, 97530 therapeutic activity, 97112 neuromuscular re-education, 97140 manual therapy, 97018 paraffin, 52841 moist heat, 97034 contrast bath, coping strategies training, patient/family education, and DME and/or AE instructions  RECOMMENDED OTHER SERVICES: ST, PT  CONSULTED AND AGREED WITH PLAN OF CARE: Patient  PLAN FOR NEXT SESSION: Initiate Treatment  Danelle Earthly, MS, OTR/L  08/31/2023, 10:44 AM

## 2023-09-03 ENCOUNTER — Ambulatory Visit: Payer: Managed Care, Other (non HMO)

## 2023-09-03 ENCOUNTER — Ambulatory Visit: Payer: Managed Care, Other (non HMO) | Admitting: Physical Therapy

## 2023-09-03 DIAGNOSIS — R269 Unspecified abnormalities of gait and mobility: Secondary | ICD-10-CM

## 2023-09-03 DIAGNOSIS — R4701 Aphasia: Secondary | ICD-10-CM

## 2023-09-03 DIAGNOSIS — R2689 Other abnormalities of gait and mobility: Secondary | ICD-10-CM

## 2023-09-03 DIAGNOSIS — M6281 Muscle weakness (generalized): Secondary | ICD-10-CM

## 2023-09-03 DIAGNOSIS — R278 Other lack of coordination: Secondary | ICD-10-CM

## 2023-09-03 DIAGNOSIS — R41841 Cognitive communication deficit: Secondary | ICD-10-CM

## 2023-09-03 DIAGNOSIS — R471 Dysarthria and anarthria: Secondary | ICD-10-CM

## 2023-09-03 DIAGNOSIS — R2681 Unsteadiness on feet: Secondary | ICD-10-CM

## 2023-09-03 NOTE — Therapy (Signed)
OUTPATIENT OCCUPATIONAL THERAPY NEURO DISCHARGE NOTE  Patient Name: Gordon Garcia MRN: [id number] DOB:August 27, 1955, 68 y.o., male Today's Date: 09/03/2023   REFERRING PROVIDER: Carmelina Dane, MD  END OF SESSION:   OT End of Session - 09/03/23 1640     Visit Number 3    Number of Visits 12    Date for OT Re-Evaluation 10/08/23    OT Start Time 0800    OT Stop Time 0845    OT Time Calculation (min) 45 min    Activity Tolerance Patient tolerated treatment well    Behavior During Therapy WFL for tasks assessed/performed               Past Medical History:  Diagnosis Date   Allergic rhinitis    Atrial flutter (HCC)    GERD (gastroesophageal reflux disease)    HTN (hypertension)    Past Surgical History:  Procedure Laterality Date   COLONOSCOPY WITH PROPOFOL N/A 02/14/2021   Procedure: COLONOSCOPY WITH PROPOFOL;  Surgeon: Midge Minium, MD;  Location: [facility] ENDOSCOPY;  Service: Endoscopy;  Laterality: N/A;   Patient Active Problem List   Diagnosis Date Noted   Atrial fibrillation (HCC) 08/13/2023   Leg cramps 08/13/2023   CVA (cerebral vascular accident) (HCC) 08/10/2023   TIA (transient ischemic attack) 08/10/2023   Hx of colonic polyps    Erectile dysfunction due to diseases classified elsewhere 09/04/2019   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   Allergic rhinitis due to pollen 05/06/2014   Gastroesophageal reflux disease without esophagitis 05/06/2014   Hypertension 05/06/2014   Hypertriglyceridemia 05/06/2014   ONSET DATE: 08/13/2023  REFERRING DIAG: CVA  THERAPY DIAG:  Muscle weakness (generalized)  Other lack of coordination  Rationale for Evaluation and Treatment: Rehabilitation  SUBJECTIVE:  SUBJECTIVE STATEMENT: D/t pt and spouse having some concern with the high copay with his therapy visits, pt in agreement with plan for early OT d/c as he is now feeling confident with HEP and has been consistent to perform HEP at home.  Pt will plan to continue his HEP to  work toward meeting his personal therapy goals.   Pt accompanied by: self  PERTINENT HISTORY: Pt. Is a 68 y.o male who was diagnosed with a CVA-Acute Bilateral Basal Ganglia Infarcts, and hospitalized from 08/13/23 to 08/14/23.   PRECAUTIONS: None  WEIGHT BEARING RESTRICTIONS: No  PAIN:  Are you having pain? No  FALLS: Has patient fallen in last 6 months? No  LIVING ENVIRONMENT: Lives with: lives with their spouse Lives in: House/apartment Stairs: 6 external-one story Has following equipment at home: Grab bars, [facility]  PLOF: Independent  PATIENT GOALS: Speech  OBJECTIVE:  Note: Objective measures were completed at Evaluation unless otherwise noted.  HAND DOMINANCE: Right  ADLs:  Transfers/ambulation related to ADLs: Independent Eating: Coordination with  Grooming: minimal difficulty setting up toothpaste on the toothbrush UB Dressing: Independent LB Dressing: Independent Toileting: independent Bathing: Independent Tub Shower transfers: Modified Independent   IADLs: Shopping: has mot performed  Light housekeeping: Independent Meal Prep: Independent Community mobility:  Relies on family Medication management: Wife sets up Mining engineer management: Wife is performing  Handwriting: 50% legible for name only Works in Theatre stage manager  MOBILITY STATUS: Independent  ACTIVITY TOLERANCE: Activity tolerance: Tolerated >30 min. Of activity  FUNCTIONAL OUTCOME MEASURES:   UPPER EXTREMITY ROM:    Active ROM Right Eval WFL Left Eval [place]  Shoulder flexion    Shoulder abduction    Shoulder adduction    Shoulder extension    Shoulder internal  rotation    Shoulder external rotation    Elbow flexion    Elbow extension    Wrist flexion    Wrist extension    Wrist ulnar deviation    Wrist radial deviation    Wrist pronation    Wrist supination    (Blank rows = not tested)  UPPER EXTREMITY MMT:     MMT Right Eval 5/5 Left Eval 5/5  Shoulder flexion     Shoulder abduction    Shoulder adduction    Shoulder extension    Shoulder internal rotation    Shoulder external rotation    Middle trapezius    Lower trapezius    Elbow flexion    Elbow extension    Wrist flexion    Wrist extension    Wrist ulnar deviation    Wrist radial deviation    Wrist pronation    Wrist supination    (Blank rows = not tested)  HAND FUNCTION: Grip strength: Right: 54 lbs; Left: 73 lbs, Lateral pinch: Right: 23 lbs, Left: 31 lbs, and 3 point pinch: Right: 20 lbs, Left: 24 lbs 09/03/23: Grip strength: 58 lbs, Left: 74 lbs; Lateral pinch: Right: 26 lbs, Left: 32 lbs, and 3 point pinch: Right: 23 lbs, Left: 31 lbs  COORDINATION: 9 Hole Peg test: Right: 29 sec; Left: 18 sec 9 Hole Peg test: Right: 23 sec; Left: 18 sec  SENSATION: WFL light touch, and proprioception  EDEMA: N/A  MUSCLE TONE: WFL  COGNITION: Changes with Speech, navigating computer and cell phone, sequencing TV remote use  VISION: No change form baseline. Wears glasses for reading  PERCEPTION: WFL for tasks performed  PRAXIS: [facility] for tasks performed                                                                                                     TREATMENT DATE: 09/03/23 Therapeutic Activity: -HEP review, including functional FMC/dexterity exercises, theraputty exercises, handwriting exercises, typing and texting practice   Self Care: -Handwriting practice: reps of printed name, signature, date, address; speed reps with diagonal and circular lines -Vc for reducing speed to improve legibility/fluidity/accuracy with letter height on lined paper  PATIENT EDUCATION: Education details: HEP review Person educated: Patient Education method: Explanation, visual handouts Education comprehension: verbalized understanding, demonstrated understanding  HOME EXERCISE PROGRAM: Daily handwriting and typing practice, FMC/dexterity exercises, theraputty  GOALS: Goals reviewed with patient?  Yes  SHORT TERM GOALS: Target date: 09/17/2023    Pt. Will be independent with HEPs for the RUE. Baseline: Eval: No current HEP; 09/03/23: indep with visual handouts Goal status: achieved   LONG TERM GOALS: Target date: 10/08/2023  Pt. Will increase right grip strength by 10# to be able to  securely hold items in his hand Baseline: Eval: R: 54#, L: 73#; 09/03/23: R: 58# Goal status: partially met/improved; early d/c  2.  Pt. Will improve right pinch strength by 3# in order to be able to securely hold cell phone, or remote Baseline: Eval: Lateral pinch: R: 23#, L: 31#, 3pt. Pinch: R: 20#, L: 24#; 09/03/23: R lateral pinch: 26#;  R 3 point pinch: 23# Goal status: achieved  3.  Pt. Will improve right [facility] skills in order to be able to manipulate small objects.  Baseline: Eval: 9 hole peg test: R: 29 sec., L: 18 sec; 09/03/23: R: 23 sec Goal status: achieved  4.  Pt. Will write his name with 75% legibility Baseline: Eval: 50% legibility for name only; 09/03/23: 75% legibility with cues for slowing speed Goal status: achieved  5.  Pt. Will independently, and efficiently type a sentence in preparation for typing an email correspondence Baseline: Eval: Pt. has difficulty with typing; 09/03/23: able to type with frequent errors/goal in progress; pt has been encouraged to send an email daily to a family member as part of his HEP Goal status: improving/early d/c  6. Pt. Will navigate through a cell phone/computer to access information on the internet 75% of the time. Baseline: Eval: Pt. has difficulty with typing; 09/03/23: Pt reports improvement with navigating his cell phone; typing on computer is still a challenge; pt reports improvement with texting accuracy Goal status: improving/early d/c  ASSESSMENT: CLINICAL IMPRESSION: Pt seen for OT d/c this date.  Discussed early d/c with pt d/t pt and spouse's concern with high copay for therapy; pt preferred to d/c early as he is now feeling confident  with his HEP.  4/7 goals fully achieved, 3/7 improved.  Pt is indep with his HEP using visual handouts to continue to progress his R hand strength and coordination skills in order to continue to work towards improving handwriting fluidity and legibility, to increase accuracy with typing, and to continue to improve efficiency when manipulating small ADL supplies.  PERFORMANCE DEFICITS: in functional skills including ADLs, IADLs, coordination, dexterity, ROM, strength, Fine motor control, Gross motor control, and UE functional use, cognitive skills including problem solving and sequencing, and psychosocial skills including coping strategies and environmental adaptation.   IMPAIRMENTS: are limiting patient from ADLs, IADLs, and leisure.   CO-MORBIDITIES: may have co-morbidities  that affects occupational performance. Patient will benefit from skilled OT to address above impairments and improve overall function.  MODIFICATION OR ASSISTANCE TO COMPLETE EVALUATION: Min-Moderate modification of tasks or assist with assess necessary to complete an evaluation.  OT OCCUPATIONAL PROFILE AND HISTORY: Detailed assessment: Review of records and additional review of physical, cognitive, psychosocial history related to current functional performance.  CLINICAL DECISION MAKING: Moderate - several treatment options, min-mod task modification necessary  REHAB POTENTIAL: Good  EVALUATION COMPLEXITY: Moderate    PLAN:  OT FREQUENCY: 2x/week  OT DURATION: 6 weeks  PLANNED INTERVENTIONS: 97168 OT Re-evaluation, 97535 self care/ADL training, 16109 therapeutic exercise, 97530 therapeutic activity, 97112 neuromuscular re-education, 97140 manual therapy, 97018 paraffin, 60454 moist heat, 97034 contrast bath, coping strategies training, patient/family education, and DME and/or AE instructions  RECOMMENDED OTHER SERVICES: ST, PT  CONSULTED AND AGREED WITH PLAN OF CARE: Patient  PLAN FOR NEXT SESSION: N/A; d/c  this visit  Danelle Earthly, MS, OTR/L  09/03/2023, 4:44 PM

## 2023-09-03 NOTE — Therapy (Signed)
OUTPATIENT SPEECH LANGUAGE PATHOLOGY COGNITIVE-LINGUISTIC TREATMENT   Patient Name: Gordon Garcia MRN: [id number] DOB:[birth year], 68 y.o., male Today's Date: 09/03/2023  PCP: Carmelina Dane, MD REFERRING PROVIDER: as above   End of Session - 09/03/23 0848     Visit Number 3    Number of Visits 24    Date for SLP Re-Evaluation 11/19/23    SLP Start Time 0845    SLP Stop Time  0930    SLP Time Calculation (min) 45 min    Activity Tolerance Patient tolerated treatment well             Past Medical History:  Diagnosis Date   Allergic rhinitis    Atrial flutter (HCC)    GERD (gastroesophageal reflux disease)    HTN (hypertension)    Past Surgical History:  Procedure Laterality Date   COLONOSCOPY WITH PROPOFOL N/A 02/14/2021   Procedure: COLONOSCOPY WITH PROPOFOL;  Surgeon: Midge Minium, MD;  Location: [facility] ENDOSCOPY;  Service: Endoscopy;  Laterality: N/A;   Patient Active Problem List   Diagnosis Date Noted   Atrial fibrillation (HCC) 08/13/2023   Leg cramps 08/13/2023   CVA (cerebral vascular accident) (HCC) 08/10/2023   TIA (transient ischemic attack) 08/10/2023   Hx of colonic polyps    Erectile dysfunction due to diseases classified elsewhere 09/04/2019   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   Allergic rhinitis due to pollen 05/06/2014   Gastroesophageal reflux disease without esophagitis 05/06/2014   Hypertension 05/06/2014   Hypertriglyceridemia 05/06/2014    ONSET DATE: 08/10/23 (CVA);   REFERRING DIAG: 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 Pt accompanied by: self  PERTINENT HISTORY: Gordon Garcia is a 69 y.o. male with medical history significant for paroxysmal a flutter off anticoagulation, HTN, chronic headache and recent hospitalization for  stroke.  - In 08/10/23, patient was hospitalized for stroke with MRI brain that showed old infarcts and acute small vessel infarcts in bilateral basal ganglia. - He presented initially with TIA symptoms on 1/25, MRI brain was normal and only showed old infarcts - On 1/28 he had similar symptoms, right sided weakness and word finding difficulty, went back to ED, and this time MRI showed acute stroke, 3 small spots in basal ganglia.  - Notably, he stopped taking anticoagulation for 6 months    PAIN:  Are you having pain? No  FALLS: Has patient fallen in last 6 months?  No  LIVING ENVIRONMENT: Lives with: lives with their spouse Lives in: House/apartment  PLOF:  Level of assistance: Independent with ADLs, Independent with IADLs Employment: Full-time employment prior to CVA   PATIENT GOALS    for speech and cognition to improve   OBJECTIVE:  TODAY'S TREATMENT:   Pt completed the following PROMs:  The Communication Effectiveness Survey is a patient-reported outcome measure in which the patient rates their own effectiveness in different communication situations. A higher score indicates greater effectiveness.   Pt's self-rating was 17/32.   Having a conversation with a family member or friends at home. 2 Participating in conversation with strangers in a quiet place. 2 Conversing with a familiar person over the telephone. 2 Conversing with a stranger over the telephone. 2 Being part of a conversation in a noisy environment (social gathering). 2 Speaking to a friend when you are emotionally upset or you are angry. 3  Having a conversation while traveling in a car. 2 Having a conversation with someone at a distance (across a room). 2   The Neuro-QOLT Item Bank v2.0-Cognition Function-Short Form is an eight-item test designed to measure difficulties with cognitive functioning (e.g., memory, attention and decision making or in the application of such abilities to everyday tasks (e.g.,  planning, organizing, calculating, remembering and learning). Source: Duwaine Maxin, J-S, et al. (2012). Neuro-QOL: brief measures of health-related quality of life for clinical research in neurology. Neurology, 78(23), [phone].   In the past 7 days...  I had to read something several times to understand it. 4- Rarely (once)  My thinking was slow. 3- Sometimes (2-3 times)  I had to work really hard to pay attention or I would make a mistake. 3- Sometimes (2-3 times)  I had trouble concentrating. 3- Sometimes (2-3 times)   How much DIFFICULTY do you currently have...  Reading and following complex instructions (e.g., directions for a new medication)? 5- None  Planning for and keeping appointments that are not part of your weekly routine (e.g., a therapy or doctor appointment, or a social gathering with friends and family)? 5- None  Managing your time to do most of your daily activities? 5- None  Learning new tasks or instructions? 5- None   T-SCORE: 56.3; less than 1 SD above mean of 50  *SLP questions pt's insight into CLOF for cognitive-linguistic functioning. Self-reporting may not be accurate given severity of cognitive-linguistic impairment on assessment.  Introduced Nutritional therapist for [employer] and to facilitate cognitive-linguistic tx. With extra time, pt completed account set up. Directions also emailed to wife per pt/wife request. App utilized for introduction of semantic features analysis which pt demonstrated with rare-min cues for common and infrequent objects. Pt also answered questions re: prescription labels with extra time on app. Sequencing/executive functioning targeted - pt sequenced 5-7 step ADLs (x3) with ~70% accuracy and min/mod verbal cues to check for errors/amend errors.   Pt also completed divergent naming task - naming x3 items per concrete category with extra time. Seemingly improved wordfinding this date.   Pt educated re: rationale for PROM, results of PROM, CLOF,  progress to date, and SLP POC.   PATIENT EDUCATION: Education details: as above Person educated: Patient Education method: Explanation Education comprehension: verbalized understanding and needs further education  HOME EXERCISE PROGRAM:   Via TalkPath Therapy    GOALS:  Goals reviewed with patient? Yes  SHORT TERM GOALS: Target date: 10 sessions  Pt will participate in further assessment of functional cognitive-linguistic ability.  Baseline: Goal status: MET  2.  Pt will completed PROMs re: communication and cognitive-linguistic functioning. Baseline:  Goal status: MET  3.  Pt will utilize compensatory strategies for anomia with min A during structured tasks. Baseline:  Goal status: INITIAL  4.  Pt will write short messages of 3-4 sentences with min A for use of compensatory strategies for spelling and grammar.   Baseline:  Goal status: INITIAL  5.  Pt will demonstrated paragraph level reading comprehension with >80% and min A for use of compensatory strategies. Baseline:  Goal status: INITIAL    LONG TERM GOALS: Target date: 12 weeks; 11/19/23  Pt utilize compensatory strategies for anomia with no more than min cues during conversation based tasks. Baseline:  Goal status: INITIAL  2.   Pt will write paragraph-level materials relating to personal interests, financial and/or medical matters with intact spelling and grammar with >90% accuracy and use of compensatory strategies, as  needed.  Baseline:  Goal status: INITIAL  3.  Pt will read paragraph-level materials relating to personal interests, financial, and/or medical matters demonstrating comprehension with >90% accuracy and use of compensatory strategies, as needed. Baseline:  Goal status: INITIAL  4.  Pt will endorse improvement in cognitive-communication per PROM. Baseline:  Goal status: INITIAL  ASSESSMENT:  CLINICAL IMPRESSION: Pt is a 68 y.o. male who presents today for a speech/language  evaluation in setting of recent stroke. Initial SLP assessment completed via functional/dynamic assessment and formal assessment (Western Aphasia Battery-Revised). 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 Pt with wordfinding deficits which are most appreciated in conversational speech, picture description tasks, and divergent naming. Pt with relatively intact auditory comprehension with pt benefiting from extra time and self correction. Additionally, pt with mildly reduced vocal loudness with limited-to-impact on intelligibility. Impaired functional reading and writing noted. During today's tx, cognitive-linguistic evaluation via Cognitive Linguistic Quick Test. Pt presents with mild cognitive-linguistic deficits affecting attention, memory, language, and visuospatial skills. Recommend course of ST   OBJECTIVE IMPAIRMENTS include receptive language, aphasia, and cognitive-communication . These impairments are limiting patient from return to work, effectively communicating at home and in community, and iADLs . Factors affecting potential to achieve goals and functional outcome are severity of impairments. Patient will benefit from skilled SLP services to address above impairments and improve overall function.  REHAB POTENTIAL: Good  PLAN: SLP FREQUENCY: 1-2x/week  SLP DURATION: 12 weeks  PLANNED INTERVENTIONS: Environmental controls, Cueing hierachy, Cognitive reorganization, Internal/external aids, Functional tasks, Multimodal communication approach, SLP instruction and feedback, Compensatory strategies, and Patient/family education    Clyde Canterbury, M.S., CCC-SLP Speech-Language Pathologist [facility] [phone] Arnette Felts)  [facility] at [facility] [address] [place], [place], [address] Phone: [phone]   Fax:  [phone]

## 2023-09-03 NOTE — Therapy (Signed)
OUTPATIENT PHYSICAL THERAPY NEURO TREATMENT   Patient Name: Gordon Garcia MRN: [id number] DOB:04/25/1956, 68 y.o., male Today's Date: 09/03/2023   PCP: Carmelina Dane, MD  REFERRING PROVIDER: Carmelina Dane, MD   END OF SESSION:  PT End of Session - 09/03/23 0933     Visit Number 4    Number of Visits 16    Date for PT Re-Evaluation 10/15/23    Progress Note Due on Visit 10    PT Start Time 0933    PT Stop Time 1015    PT Time Calculation (min) 42 min    Equipment Utilized During Treatment Gait belt    Activity Tolerance Patient tolerated treatment well    Behavior During Therapy WFL for tasks assessed/performed             Past Medical History:  Diagnosis Date   Allergic rhinitis    Atrial flutter (HCC)    GERD (gastroesophageal reflux disease)    HTN (hypertension)    Past Surgical History:  Procedure Laterality Date   COLONOSCOPY WITH PROPOFOL N/A 02/14/2021   Procedure: COLONOSCOPY WITH PROPOFOL;  Surgeon: Midge Minium, MD;  Location: [facility] ENDOSCOPY;  Service: Endoscopy;  Laterality: N/A;   Patient Active Problem List   Diagnosis Date Noted   Atrial fibrillation (HCC) 08/13/2023   Leg cramps 08/13/2023   CVA (cerebral vascular accident) (HCC) 08/10/2023   TIA (transient ischemic attack) 08/10/2023   Hx of colonic polyps    Erectile dysfunction due to diseases classified elsewhere 09/04/2019   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   Allergic rhinitis due to pollen 05/06/2014   Gastroesophageal reflux disease without esophagitis 05/06/2014   Hypertension 05/06/2014   Hypertriglyceridemia 05/06/2014    ONSET DATE: 08/13/2023  REFERRING DIAG:  Diagnosis  I63.9 (ICD-10-CM) - CVA (cerebral vascular accident) (HCC)    THERAPY DIAG:  Muscle weakness (generalized)  Other lack of coordination  Abnormality of gait  Balance disorder  Unsteadiness on feet  Rationale for Evaluation and Treatment: Rehabilitation  SUBJECTIVE:                                                                                                                                                                                              SUBJECTIVE STATEMENT: Pt reports doing well today. Apologizes for missing last PT session and miscommunication.  No falls or change in medical status. Feels like speech in slowly improving.     Pt accompanied by: self  PERTINENT HISTORY:  Gordon Garcia is a 68 y.o. male with medical history significant for paroxysmal a flutter off anticoagulation, HTN, chronic headache and recent hospitalization for stroke.  -  In 08/10/23, patient was hospitalized for stroke with MRI brain that showed old infarcts and acute small vessel infarcts in bilateral basal ganglia. - He presented initially with TIA symptoms on 1/25, MRI brain was normal and only showed old infarcts - On 1/28 he had similar symptoms, right sided weakness and word finding difficulty, went back to ED, and this time MRI showed acute stroke, 3 small spots in basal ganglia.  - Notably, he stopped taking anticoagulation for 6 months  From Eval: pt reports to PT with new CVA with mild aphasia. States that since CVA, his balance, strength and coordination have been affect. Sister present with pt; questions about the benefits and OT and SLP, and hoping to get referral for additional services.    PAIN:  Are you having pain? No  PRECAUTIONS: None  RED FLAGS: None   WEIGHT BEARING RESTRICTIONS: No  FALLS: Has patient fallen in last 6 months? No  LIVING ENVIRONMENT: Lives with: lives with their spouse Lives in: House/apartment Stairs: Yes: External: 6 steps; none Has following equipment at home: None  PLOF: Independent and Independent with basic ADLs  PATIENT GOALS: improve speech and coordination. 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  DIAGNOSTIC FINDINGS:  MRI  2/30 IMPRESSION: Known acute infarct in the left basal ganglia and corona radiata, more confluent than seen on 08/13/2023 scan but still in the same distribution. No new territory infarct or interval hemorrhage.  COGNITION: Overall cognitive status: Within functional limits for tasks assessed and new speech impairmments from CVA    SENSATION: WFL  COORDINATION: Mild decreased Speed with ankle to knee on the RLE  Finger to nose: decreased speed on the LLE.   EDEMA:  WFL  MUSCLE TONE:WFL    POSTURE: rounded shoulders and forward head  LOWER EXTREMITY ROM:     Active  Right Eval Left Eval  Hip flexion [facility]  Hip extension    Hip abduction [facility] [place]  Hip adduction [facility]  Hip internal rotation    Hip external rotation    Knee flexion [facility] WFL  Knee extension [facility] WFL  Ankle dorsiflexion    Ankle plantarflexion    Ankle inversion    Ankle eversion     (Blank rows = not tested)  LOWER EXTREMITY MMT:    MMT Right Eval Left Eval  Hip flexion 4+ 4+  Hip extension    Hip abduction 5 5  Hip adduction 4+ 4+  Hip internal rotation    Hip external rotation    Knee flexion 4+ 5  Knee extension 5 5  Ankle dorsiflexion 4+ 4+  Ankle plantarflexion    Ankle inversion    Ankle eversion    (Blank rows = not tested)  BED MOBILITY:  Sit to supine Complete Independence Supine to sit Complete Independence  TRANSFERS: Assistive device utilized: None  Sit to stand: Complete Independence Stand to sit: Complete Independence Chair to chair: Complete Independence Floor: Min A  RAMP:  Level of Assistance: Complete Independence Assistive device utilized: None Ramp Comments:   CURB:  Level of Assistance: CGA Assistive device utilized: None Curb Comments:   STAIRS: Level of Assistance: Modified independence Stair Negotiation Technique: Alternating Pattern  with Single Rail on Right Number of Stairs: 4  Height of Stairs: 6  Comments:   GAIT: Gait pattern:  decreased arm swing- Right, decreased arm swing- Left, and decreased stride length  Distance walked: 80 Assistive device utilized: None Level of assistance: SBA Comments: decreased step length. Pt reports feeling "unsteady" at times with ambulation. No overt deficits noted   FUNCTIONAL TESTS:  5 times sit to stand: 13.59 Timed up and go (TUG): 12.58 6 minute walk test: 1336ft 10 meter walk test: 10.58 and 10.69 Berg Balance Scale: 47 Functional gait assessment: 22      PATIENT SURVEYS:  Stroke Impact Scale 59/80                                                                                                                              TREATMENT DATE: 09/03/2023   TA- To improve functional movements patterns for everyday tasks  octane level 1-4x 5 min  or aerobic/neurologic priming and endurance training   On airex pad:  Static stance 2 x 30 sec  Ball raise overhead x 15  1 foot on pad, 1 foot on 6 inch step: static hold 2 x 15 bil; ball tap on parallel bars x 15 bil on BLE.  Stepping over 4 inch hurdle forward no resistance x 15 and with 4#AW x 15.  Stepping over 4 inch hurdle lateral no resistance x 15 and with 4#AW x 15.  Weighted gait training x 668ft with 4# AW  Weighted Stair navigation, forward ascent/posterior descent; 4 steps x 4  Sit<>stand x 10 with no UE support Cues for step height and length with weighted gait and improved posture throughout. Noted improvement with step height and reduced foot drag on the RLE following instruction from PT.     PATIENT EDUCATION: Education details: POC, Pt educated throughout session about proper posture and technique with exercises. Improved exercise technique, movement at target joints, use of target muscles after min to mod verbal, visual, tactile cues.  Person educated: Patient and sister Education method: Explanation Education comprehension: verbalized understanding  HOME EXERCISE PROGRAM: Access Code: [email or web address] URL:  [email or web address] Date: 08/23/2023 Prepared by: Grier Rocher  Exercises - Tandem Stance with Support  - 1 x daily - 7 x weekly - 3 sets - 6 reps - 20 hold - Single Leg Stance with Support  - 1 x daily - 7 x weekly - 3 sets - 6 reps - 20 hold - Feet Together Balance at [employer] Eyes Closed  - 1 x daily - 7 x weekly - 3 sets - 5 reps - 15 hold - Corner Balance Feet Together: Eyes Closed With Head Turns  - 1 x daily - 7 x weekly - 3 sets - 15 reps - 2 hold  GOALS: Goals reviewed with patient? Yes   SHORT TERM GOALS: Target date: 09/17/2023    Patient will be independent in home exercise program to improve strength/mobility for better functional independence with ADLs. Baseline: to be given at visit 2  Goal status: INITIAL   LONG TERM GOALS: Target date: 10/15/2023    Patient will increase SIS score by equal to or  greater than  10 points  to demonstrate statistically significant improvement in mobility and quality of life.  Baseline: 59 Goal status: INITIAL  2.  Patient (> 60 years old) will increase 6 min walk test by >151ft to indicate improve activity tolerance, and access to community   Baseline: 1330ft Goal status: INITIAL  3.  Patient will increase Berg Balance score by > 6 points to demonstrate decreased fall risk during functional activities Baseline: 47 Goal status: INITIAL  4.  Patient will increase 10 meter walk test to >1.49m/s as to improve gait speed for better community ambulation and to reduce fall risk. Baseline: 0.91m/s Goal status: INITIAL  5.  Patient will reduce timed up and go to <11 seconds to reduce fall risk and demonstrate improved transfer/gait ability. Baseline: 12.58 Goal status: INITIAL  6.  Patient will increase FGA score by 3 points  as to demonstrate reduced fall risk and improved dynamic gait balance for better safety with community/home ambulation.   Baseline: 22 Goal status: INITIAL   ASSESSMENT:  CLINICAL  IMPRESSION: Patient presents with good motivation for completion of physical therapy activities.  Apologetic for missing last skilled PT session. PT treatment focused on continued dynamic balance and gait training with variable resistance and use of compliant surfaces to mimic real world environment. No LOB noted and improved step height on the RLE with instruction from PT. Pt will continue to benefit from skilled physical therapy intervention to address impairments, improve QOL, and attain therapy goals.    OBJECTIVE IMPAIRMENTS: Abnormal gait, decreased activity tolerance, decreased balance, decreased coordination, decreased endurance, and decreased knowledge of condition.   ACTIVITY LIMITATIONS: stairs, locomotion level, and caring for others  PARTICIPATION LIMITATIONS: cleaning, interpersonal relationship, driving, shopping, community activity, occupation, and yard work  PERSONAL FACTORS: 1 comorbidity: HTN and paroxysmal a flutter   are also affecting patient's functional outcome.   REHAB POTENTIAL: Excellent  CLINICAL DECISION MAKING: Stable/uncomplicated  EVALUATION COMPLEXITY: Moderate  PLAN:  PT FREQUENCY: 1-2x/week  PT DURATION: 8 weeks  PLANNED INTERVENTIONS: 97110-Therapeutic exercises, 97530- Therapeutic activity, [id number]- Neuromuscular re-education, 97535- Self Care, 16109- Manual therapy, [phone]- Gait training, Patient/Family education, Balance training, Stair training, Taping, Dry Needling, Joint mobilization, Joint manipulation, DME instructions, Cryotherapy, and Moist heat  PLAN FOR NEXT SESSION:   Continue Dynamic balance training with narrow and single stance. Generalized strengthening   Golden Pop, PT 09/03/2023, 9:50 AM

## 2023-09-05 ENCOUNTER — Ambulatory Visit: Payer: Managed Care, Other (non HMO) | Admitting: Physical Therapy

## 2023-09-05 ENCOUNTER — Ambulatory Visit: Payer: Managed Care, Other (non HMO)

## 2023-09-10 ENCOUNTER — Ambulatory Visit: Payer: Managed Care, Other (non HMO) | Admitting: Physical Therapy

## 2023-09-12 ENCOUNTER — Ambulatory Visit: Payer: Managed Care, Other (non HMO) | Admitting: Physical Therapy

## 2023-09-12 ENCOUNTER — Encounter: Payer: 59 | Admitting: Occupational Therapy

## 2023-09-17 ENCOUNTER — Encounter: Payer: 59 | Admitting: Occupational Therapy

## 2023-09-17 ENCOUNTER — Ambulatory Visit: Payer: Managed Care, Other (non HMO) | Admitting: Physical Therapy

## 2023-09-18 ENCOUNTER — Telehealth: Payer: Self-pay

## 2023-09-18 NOTE — Telephone Encounter (Signed)
 Spoke with pt's wife, Okey Regal. Per wife, pt self-discharged from ST and PT. Discussed pt's CLOF (based on last ST session), progress to date, changes to cognitive-linguistic functioning and functional implications, and activities for HEP (assigned on TalkPath app). Wife aware that should pt wish to resume ST, he will need a new order.  Clyde Canterbury, M.S., CCC-SLP Speech-Language Pathologist [facility] [phone] (ASCOM)

## 2023-09-19 ENCOUNTER — Ambulatory Visit: Payer: Managed Care, Other (non HMO)

## 2023-09-19 ENCOUNTER — Ambulatory Visit: Payer: Managed Care, Other (non HMO) | Admitting: Physical Therapy

## 2023-09-24 ENCOUNTER — Ambulatory Visit: Payer: 59

## 2023-09-24 ENCOUNTER — Encounter: Payer: 59 | Admitting: Occupational Therapy

## 2023-09-26 ENCOUNTER — Encounter: Payer: 59 | Admitting: Occupational Therapy

## 2023-09-26 ENCOUNTER — Ambulatory Visit: Payer: 59

## 2023-10-01 ENCOUNTER — Ambulatory Visit: Payer: 59 | Admitting: Physical Therapy

## 2023-10-03 ENCOUNTER — Ambulatory Visit: Payer: 59

## 2023-10-08 ENCOUNTER — Ambulatory Visit: Payer: 59 | Admitting: Physical Therapy

## 2023-10-10 ENCOUNTER — Ambulatory Visit: Payer: 59 | Admitting: Physical Therapy

## 2023-10-15 ENCOUNTER — Ambulatory Visit: Payer: 59

## 2023-10-17 ENCOUNTER — Ambulatory Visit: Payer: 59

## 2023-10-22 ENCOUNTER — Ambulatory Visit: Payer: 59

## 2023-10-24 ENCOUNTER — Ambulatory Visit: Payer: 59

## 2023-10-24 ENCOUNTER — Encounter: Payer: 59 | Admitting: Occupational Therapy

## 2023-10-29 ENCOUNTER — Ambulatory Visit: Payer: 59

## 2023-10-31 ENCOUNTER — Ambulatory Visit: Payer: 59

## 2023-11-05 ENCOUNTER — Ambulatory Visit: Payer: 59

## 2023-11-07 ENCOUNTER — Ambulatory Visit: Payer: 59

## 2023-11-12 ENCOUNTER — Ambulatory Visit: Payer: 59

## 2023-11-14 ENCOUNTER — Ambulatory Visit: Payer: 59

## 2023-11-19 ENCOUNTER — Ambulatory Visit: Payer: 59

## 2023-11-21 ENCOUNTER — Ambulatory Visit: Payer: 59

## 2023-11-26 ENCOUNTER — Ambulatory Visit: Payer: 59

## 2023-11-28 ENCOUNTER — Ambulatory Visit: Payer: 59

## 2023-12-03 ENCOUNTER — Ambulatory Visit: Attending: Internal Medicine

## 2023-12-03 ENCOUNTER — Ambulatory Visit: Payer: 59

## 2023-12-03 DIAGNOSIS — R41841 Cognitive communication deficit: Secondary | ICD-10-CM | POA: Insufficient documentation

## 2023-12-03 NOTE — Therapy (Signed)
 OUTPATIENT SPEECH LANGUAGE PATHOLOGY  EVALUATION   Patient Name: Gordon Garcia MRN: [id number] DOB:[birth year], 68 y.o., male Today's Date: 12/03/2023  PCP: Alena Hush, MD  REFERRING PROVIDER: as Barbee Lew of Session - 12/03/23 1209     Visit Number 1    Number of Visits 24    Date for SLP Re-Evaluation 02/25/24    SLP Start Time 0842    SLP Stop Time  0930    SLP Time Calculation (min) 48 min             No past medical history on file. The histories are not reviewed yet. Please review them in the "History" navigator section and refresh this SmartLink. 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   Pt accompanied by: self  PERTINENT HISTORY: Gordon Garcia is a 68 y.o. male with medical history significant for paroxysmal a flutter off anticoagulation, HTN, chronic headache and recent hospitalization for stroke.   - In 08/10/23, patient was hospitalized for stroke with MRI brain that showed old infarcts and acute small vessel infarcts in bilateral basal ganglia. - He presented initially with TIA symptoms on 1/25, MRI brain was normal and only showed old infarcts - On 1/28 he had similar symptoms, right  sided weakness and word finding difficulty, went back to ED, and this time MRI showed acute stroke, 3 small spots in basal ganglia.  **Pt started OP ST 08/27/23 and self-discharged on 09/03/23.  DIAGNOSTIC FINDINGS: Most recent MRI, 09/07/22, "Known acute infarct in the left basal ganglia and corona radiata, more confluent than seen on 08/13/2023 scan but still in the same distribution. No new territory infarct or interval hemorrhage."  PAIN:  Are you having pain? No   FALLS: Has patient fallen in last 6 months?  No  LIVING ENVIRONMENT: Lives with: lives with their family Lives in: House/apartment  PLOF:  Level of assistance: Independent with ADLs Employment: Other: pt stated he is planning to retire, but would like to find some "part time" work in the near future   PATIENT GOALS   to return to PLOF; to be able to "debate"  OBJECTIVE:   COGNITIVE COMMUNICATION: Overall cognitive status: Impaired Areas of impairment:  Attention: Impaired: Sustained, Selective, Divided Memory: Impaired: Working Teacher, music term Psychologist, educational function: Impaired: Error awareness, Self-correction, and Comment: Reasoning  AUDITORY COMPREHENSION: Overall auditory comprehension: Appears intact for tasks assessed  READING COMPREHENSION: Appeared WFL  EXPRESSION: verbal  VERBAL EXPRESSION: Overall appeared WFL with exception of divergent naming Pragmatics: Impaired: abnormal effect Comments: flat affect   WRITTEN EXPRESSION: Dominant hand: right   Written expression: Appears intact  MOTOR SPEECH: WFL  ORAL MOTOR EXAMINATION: WFL  STANDARDIZED ASSESSMENTS:   Cognitive Linguistic Quick Test: AGE - 18 - 69   The Cognitive Linguistic Quick Test (  CLQT) was administered to assess the relative status of five cognitive domains: attention, memory, language, executive functioning, and visuospatial skills. Scores from 10 tasks were used to estimate severity ratings (standardized for age groups 18-69 years  and 70-89 years) for each domain, a clock drawing task, as well as an overall composite severity rating of cognition.       Task Score Criterion Cut Scores  Personal Facts 8/8 8  Symbol Cancellation 11/12 11  Confrontation Naming 10/10 10  Clock Drawing  13/13 12  Story Retelling 6/10 6  Symbol Trails 9/10 9  Generative Naming 5/9 5  Design Memory 6/6 5  Mazes  6/8 7  Design Generation 7/13 6    Cognitive Domain Composite Score Severity Rating  Attention 181/215 Mild  Memory 157/185 WNL  Executive Function 27/40 WNL  Language 29/37 WNL  Visuospatial Skills 89/105 WNL  Clock Drawing  13/13 WNL  Composite Severity Rating  WNL           PATIENT REPORTED OUTCOME MEASURES (PROM):  The Neuro-QOLT Item Bank v2.0-Cognition Function-Short Form is an eight-item test designed to measure difficulties with cognitive functioning (e.g., memory, attention and decision making or in the application of such abilities to everyday tasks (e.g., planning, organizing, calculating, remembering and learning). Source: Paula Born, J-S, et al. (2012). Neuro-QOL: brief measures of health-related quality of life for clinical research in neurology. Neurology, 78(23), [phone].   In the past 7 days...  I had to read something several times to understand it. 3- Sometimes (2-3 times)  My thinking was slow. 4- Rarely (once)  I had to work really hard to pay attention or I would make a mistake. 4- Rarely (once)  I had trouble concentrating. 3- Sometimes (2-3 times)   How much DIFFICULTY do you currently have...  Reading and following complex instructions (e.g., directions for a new medication)? 5- None  Planning for and keeping appointments that are not part of your weekly routine (e.g., a therapy or doctor appointment, or a social gathering with friends and family)? 5- None  Managing your time to do most of your daily activities? 5- None  Learning new tasks or instructions? 5- None   T-SCORE:  47.1,  mean of 50   TODAY'S TREATMENT:  Pt educated re: role of SLP, results of assessment, domains of cognition, domains of cognition affecting by stroke, SLP goal setting, and SLP POC. Pt verbalized understanding/agreement. Supportive counseling provided re: navigating the "new normal" re: cognitive-communication.   PATIENT EDUCATION: Education details: as above Person educated: Patient Education method: Explanation Education comprehension: verbalized understanding and needs further education  HOME EXERCISE PROGRAM:        To be given in upcoming sessions as appropriate    GOALS:  Goals reviewed with patient? Yes  SHORT TERM GOALS: Target date: 10 sessions  Pt will ID x2-3 strategies to improve functional attention, memory, and executive functioning with min A.  Baseline: Goal status: INITIAL   2.  Pt will report implementation of 1-2 strategies to improve functional attention, memory, and executive functioning. Baseline:  Goal status: INITIAL  3. In one-on-one discussions, the pt will respond to a differing opinion by restating the alternate viewpoint and offering a reasoned response (agreeing or disagreeing respectfully) in 3 out of 5 opportunities with cues as needed. Baseline:  Goal status: INITIAL  LONG TERM GOALS: Target date: 12 weeks  Pt will express understanding of ways to promote cognitive-linguistic functioning outside of therapy sessions.  Baseline:  Goal  status: INITIAL  2.  Pt will improve reasoning and perspective-taking skills by generating multiple appropriate viewpoints in structured and unstructured tasks to enhance social communication with min cueing.  Baseline:  Goal status: INITIAL   ASSESSMENT:  CLINICAL IMPRESSION:  Patient is a 68 y.o. male who was seen today for cognitive-communication evaluation in setting of stroke in January 2025. Of note, pt known to SLP from short course of ST services in which pt self-discharged. Pt reports improving  cognitive-communication, but difficulty with attention, memory, and being able to "debate" or "banter" with friends. Assessment today completed via formal assessment (Cognitive Linguistic Quick Test) and PROM (Neuro-QOLT Item Bank v2.0-Cognition Function-Short Form). Pt with improved cognitive-communicative functioning per CLQT compared to 08/29/23; however, pt continues with mild cognitive-communication deficits affecting attention, memory, and executive functioning based on CLQT,  Neuro-QOLT Item Bank v2.0-Cognition Function-Short Form, and subjective reports. Suspect all domains of cognition are impacting by impaired sustained, alternating, and divided attention. Recommend course of ST services targeting above mentioned deficits.  OBJECTIVE IMPAIRMENTS include attention, memory, and executive functioning. These impairments are limiting patient from return to work and effectively communicating at home and in community. Factors affecting potential to achieve goals and functional outcome are none.. Patient will benefit from skilled SLP services to address above impairments and improve overall function.  REHAB POTENTIAL: Good  PLAN: SLP FREQUENCY: 1-2x/week  SLP DURATION: 12 weeks  PLANNED INTERVENTIONS: Cueing hierachy, Cognitive reorganization, Internal/external aids, Functional tasks, and SLP instruction and feedback   Dia Forget, M.S., CCC-SLP Speech-Language Pathologist [place] [facility] [phone] [place] Clayman)   [facility] at [facility] [address] [place], [place], [address] Phone: [phone]   Fax:  [phone]

## 2023-12-05 ENCOUNTER — Ambulatory Visit: Payer: 59

## 2023-12-05 ENCOUNTER — Ambulatory Visit

## 2023-12-05 DIAGNOSIS — R41841 Cognitive communication deficit: Secondary | ICD-10-CM | POA: Diagnosis not present

## 2023-12-05 NOTE — Therapy (Signed)
 OUTPATIENT SPEECH LANGUAGE PATHOLOGY  TREATMENT   Patient Name: Gordon Garcia MRN: [id number] DOB:1955-11-17, 68 y.o., male Today's Date: 12/05/2023  PCP: Alena Hush, MD  REFERRING PROVIDER: as Barbee Lew of Session - 12/05/23 1118     Visit Number 2    Number of Visits 24    Date for SLP Re-Evaluation 02/25/24    SLP Start Time 0940    SLP Stop Time  1030    SLP Time Calculation (min) 50 min    Activity Tolerance Patient tolerated treatment well             No past medical history on file. The histories are not reviewed yet. Please review them in the "History" navigator section and refresh this SmartLink. 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   Pt accompanied by: self  PERTINENT HISTORY: Gordon Garcia is a 68 y.o. male with medical history significant for paroxysmal a flutter off anticoagulation, HTN, chronic headache and recent hospitalization for stroke.   - In 08/10/23, patient was hospitalized for stroke with MRI brain that showed old infarcts and acute small vessel infarcts in bilateral basal ganglia. - He presented initially with TIA symptoms on 1/25, MRI brain was normal and only showed  old infarcts - On 1/28 he had similar symptoms, right sided weakness and word finding difficulty, went back to ED, and this time MRI showed acute stroke, 3 small spots in basal ganglia.  **Pt started OP ST 08/27/23 and self-discharged on 09/03/23.  DIAGNOSTIC FINDINGS: Most recent MRI, 09/07/22, "Known acute infarct in the left basal ganglia and corona radiata, more confluent than seen on 08/13/2023 scan but still in the same distribution. No new territory infarct or interval hemorrhage."  PAIN:  Are you having pain? No   FALLS: Has patient fallen in last 6 months?  No  LIVING ENVIRONMENT: Lives with: lives with their family Lives in: House/apartment  PLOF:  Level of assistance: Independent with ADLs Employment: Other: pt stated he is planning to retire, but would like to find some "part time" work in the near future   PATIENT GOALS   to return to PLOF; to be able to "debate"  OBJE  TODAY'S TREATMENT:  Reviewed results of assessment and current SLP goals. Education initiated targeting compensations for attention, memory, and executive functioning. Pt identified x3 strategies to implement including, telling self to "focus," scheduling regular rest breaks, and repeating items/reading aloud as well as visualizing items he'd like to remember. Will check in on implementation/success of strategies during next session. Pt noted that multitasking can be a struggle at times, will also target in upcoming sessions.   PATIENT EDUCATION: Education details: as above Person educated: Patient Education method: Explanation Education comprehension: verbalized understanding and needs further education  HOME EXERCISE PROGRAM:  Implementation of patient-selected compensations for attention, memory, executive functioning    GOALS:  Goals reviewed with patient? Yes  SHORT TERM GOALS: Target date: 10 sessions  Pt will ID x2-3 strategies to improve functional attention, memory, and  executive functioning with min A.  Baseline: Goal status: INITIAL   2.  Pt will report implementation of 1-2 strategies to improve functional attention, memory, and executive functioning. Baseline:  Goal status: INITIAL  3. In one-on-one discussions, the pt will respond to a differing opinion by restating the alternate viewpoint and offering a reasoned response (agreeing or disagreeing respectfully) in 3 out of 5 opportunities with cues as needed. Baseline:  Goal status: INITIAL  LONG TERM GOALS: Target date: 12 weeks  Pt will express understanding of ways to promote cognitive-linguistic functioning outside of therapy sessions.  Baseline:  Goal status: INITIAL  2.  Pt will improve reasoning and perspective-taking skills by generating multiple appropriate viewpoints in structured and unstructured tasks to enhance social communication with min cueing.  Baseline:  Goal status: INITIAL   ASSESSMENT:  CLINICAL IMPRESSION:  Patient is a 68 y.o. male who was seen today for cognitive-communication treatnebt in setting of stroke in January 2025. Of note, pt known to SLP from short course of ST services in which pt self-discharged. Pt reports improving cognitive-communication, but difficulty with attention, memory, and being able to "debate" or "banter" with friends. Assessment, 5/20, completed via formal assessment (Cognitive Linguistic Quick Test) and PROM (Neuro-QOLT Item Bank v2.0-Cognition Function-Short Form). Pt with improved cognitive-communicative functioning per CLQT compared to 08/29/23; however, pt continues with mild cognitive-communication deficits affecting attention, memory, and executive functioning based on CLQT,  Neuro-QOLT Item Bank v2.0-Cognition Function-Short Form, and subjective reports. Suspect all domains of cognition are impacting by impaired sustained, alternating, and divided attention. See details of tx session above. Recommend course of ST services targeting above  mentioned deficits.  OBJECTIVE IMPAIRMENTS include attention, memory, and executive functioning. These impairments are limiting patient from return to work and effectively communicating at home and in community. Factors affecting potential to achieve goals and functional outcome are none.. Patient will benefit from skilled SLP services to address above impairments and improve overall function.  REHAB POTENTIAL: Good  PLAN: SLP FREQUENCY: 1-2x/week  SLP DURATION: 12 weeks  PLANNED INTERVENTIONS: Cueing hierachy, Cognitive reorganization, Internal/external aids, Functional tasks, and SLP instruction and feedback   Dia Forget, M.S., CCC-SLP Speech-Language Pathologist [place] - [facility] [phone] Rogers Clayman)   [place] [facility] Outpatient Rehabilitation at [facility] [address] [place], [place], [address] Phone: [phone]   Fax:  [phone]

## 2023-12-10 ENCOUNTER — Ambulatory Visit: Payer: 59

## 2023-12-10 ENCOUNTER — Ambulatory Visit

## 2023-12-10 DIAGNOSIS — R41841 Cognitive communication deficit: Secondary | ICD-10-CM

## 2023-12-10 NOTE — Therapy (Signed)
 OUTPATIENT SPEECH LANGUAGE PATHOLOGY  TREATMENT   Patient Name: Gordon Garcia MRN: [id number] DOB:[birth year], 68 y.o., male Today's Date: 12/10/2023  PCP: Alena Hush, MD  REFERRING PROVIDER: as Barbee Lew of Session - 12/10/23 4098     Visit Number 3    Number of Visits 24    Date for SLP Re-Evaluation 02/25/24    Authorization Type [id number] combined OT/SLP per year- 16 remain    Authorization - Visit Number 3    Authorization - Number of Visits 16    Progress Note Due on Visit 10    SLP Start Time 0840    SLP Stop Time  0930    SLP Time Calculation (min) 50 min    Activity Tolerance Patient tolerated treatment well             No past medical history on file. The histories are not reviewed yet. Please review them in the "History" navigator section and refresh this SmartLink. 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   Pt accompanied by: self  PERTINENT HISTORY: Gordon Garcia is a 68 y.o. male with medical history significant for paroxysmal a flutter off anticoagulation, HTN, chronic headache and recent hospitalization for stroke.   - In 08/10/23, patient was hospitalized for stroke with MRI brain  that showed old infarcts and acute small vessel infarcts in bilateral basal ganglia. - He presented initially with TIA symptoms on 1/25, MRI brain was normal and only showed old infarcts - On 1/28 he had similar symptoms, right sided weakness and word finding difficulty, went back to ED, and this time MRI showed acute stroke, 3 small spots in basal ganglia.  **Pt started OP ST 08/27/23 and self-discharged on 09/03/23.  DIAGNOSTIC FINDINGS: Most recent MRI, 09/07/22, "Known acute infarct in the left basal ganglia and corona radiata, more confluent than seen on 08/13/2023 scan but still in the same distribution. No new territory infarct or interval hemorrhage."  PAIN:  Are you having pain? No   FALLS: Has patient fallen in last 6 months?  No  LIVING ENVIRONMENT: Lives with: lives with their family Lives in: House/apartment  PLOF:  Level of assistance: Independent with ADLs Employment: Other: pt stated he is planning to retire, but would like to find some "part time" work in the near future   PATIENT GOALS   to return to PLOF; to be able to "debate"  OBJECTIVE:  TODAY'S TREATMENT:  Pt seen for cognitive-linguistic treatment with emphasis on attention, memory, and executive functioning. Reviewed x3 strategies pt decided to implement including, telling self to "focus," scheduling regular rest breaks, and repeating items/reading aloud as well as visualizing items he'd like to remember. Pt reports successful implementation of strategies. Introduced factors that may impact divided attention/multitasking as well as compensations for  divided attention/multitasking. Pt identified being tasks multipart questions as an area of difficulty. Introduced Nurse, learning disability for self-advocacy for improve success. Will check in on implementation/success of strategy during next session. Introduced hot topic/debates and strategies to improve success. Pt with mild difficulty articulating specifics of multiple  viewpoints.  PATIENT EDUCATION: Education details: as above Person educated: Patient Education method: Explanation Education comprehension: verbalized understanding and needs further education  HOME EXERCISE PROGRAM:        Implementation of patient-selected compensations for attention, memory, executive functioning    GOALS:  Goals reviewed with patient? Yes  SHORT TERM GOALS: Target date: 10 sessions  Pt will ID x2-3 strategies to improve functional attention, memory, and executive functioning with min A.  Baseline: Goal status: INITIAL   2.  Pt will report implementation of 1-2 strategies to improve functional attention, memory, and executive functioning. Baseline:  Goal status: INITIAL  3. In one-on-one discussions, the pt will respond to a differing opinion by restating the alternate viewpoint and offering a reasoned response (agreeing or disagreeing respectfully) in 3 out of 5 opportunities with cues as needed. Baseline:  Goal status: INITIAL  LONG TERM GOALS: Target date: 12 weeks  Pt will express understanding of ways to promote cognitive-linguistic functioning outside of therapy sessions.  Baseline:  Goal status: INITIAL  2.  Pt will improve reasoning and perspective-taking skills by generating multiple appropriate viewpoints in structured and unstructured tasks to enhance social communication with min cueing.  Baseline:  Goal status: INITIAL   ASSESSMENT:  CLINICAL IMPRESSION:  Patient is a 68 y.o. male who was seen today for cognitive-communication treatnebt in setting of stroke in January 2025. Of note, pt known to SLP from short course of ST services in which pt self-discharged. Pt reports improving cognitive-communication, but difficulty with attention, memory, and being able to "debate" or "banter" with friends. Assessment, 5/20, completed via formal assessment (Cognitive Linguistic Quick Test) and PROM (Neuro-QOLT Item Bank v2.0-Cognition Function-Short  Form). Pt with improved cognitive-communicative functioning per CLQT compared to 08/29/23; however, pt continues with mild cognitive-communication deficits affecting attention, memory, and executive functioning based on CLQT,  Neuro-QOLT Item Bank v2.0-Cognition Function-Short Form, and subjective reports. Suspect all domains of cognition are impacting by impaired sustained, alternating, and divided attention. See details of tx session above. Recommend course of ST services targeting above mentioned deficits.  OBJECTIVE IMPAIRMENTS include attention, memory, and executive functioning. These impairments are limiting patient from return to work and effectively communicating at home and in community. Factors affecting potential to achieve goals and functional outcome are none.. Patient will benefit from skilled SLP services to address above impairments and improve overall function.  REHAB POTENTIAL: Good  PLAN: SLP FREQUENCY: 1-2x/week  SLP DURATION: 12 weeks  PLANNED INTERVENTIONS: Cueing hierachy, Cognitive reorganization, Internal/external aids, Functional tasks, and SLP instruction and feedback   Dia Forget, M.S., CCC-SLP Speech-Language Pathologist [place] - [facility] [phone] [place] Clayman)   [place] [facility] Outpatient Rehabilitation at [facility] [address] [place], [place], [address] Phone: [phone]   Fax:  [phone]

## 2023-12-12 ENCOUNTER — Ambulatory Visit: Payer: 59

## 2023-12-12 ENCOUNTER — Ambulatory Visit

## 2023-12-12 DIAGNOSIS — R41841 Cognitive communication deficit: Secondary | ICD-10-CM | POA: Diagnosis not present

## 2023-12-12 NOTE — Therapy (Signed)
 OUTPATIENT SPEECH LANGUAGE PATHOLOGY  TREATMENT   Patient Name: Gordon Garcia MRN: [id number] DOB:08-Jul-1956, 68 y.o., male Today's Date: 12/12/2023  PCP: Alena Hush, MD  REFERRING PROVIDER: as Barbee Lew of Session - 12/12/23 0957     Visit Number 4    Number of Visits 24    Date for SLP Re-Evaluation 02/25/24    Authorization - Visit Number 4    Authorization - Number of Visits 16    Progress Note Due on Visit 10    SLP Start Time 0846    SLP Stop Time  0932    SLP Time Calculation (min) 46 min    Activity Tolerance Patient tolerated treatment well             No past medical history on file. The histories are not reviewed yet. Please review them in the "History" navigator section and refresh this SmartLink. 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   Pt accompanied by: self  PERTINENT HISTORY: Gordon Garcia is a 68 y.o. male with medical history significant for paroxysmal a flutter off anticoagulation, HTN, chronic headache and recent hospitalization for stroke.   - In 08/10/23, patient was hospitalized for stroke with MRI brain that showed old infarcts and acute small vessel infarcts in  bilateral basal ganglia. - He presented initially with TIA symptoms on 1/25, MRI brain was normal and only showed old infarcts - On 1/28 he had similar symptoms, right sided weakness and word finding difficulty, went back to ED, and this time MRI showed acute stroke, 3 small spots in basal ganglia.  **Pt started OP ST 08/27/23 and self-discharged on 09/03/23.  DIAGNOSTIC FINDINGS: Most recent MRI, 09/07/22, "Known acute infarct in the left basal ganglia and corona radiata, more confluent than seen on 08/13/2023 scan but still in the same distribution. No new territory infarct or interval hemorrhage."  PAIN:  Are you having pain? No   FALLS: Has patient fallen in last 6 months?  No  LIVING ENVIRONMENT: Lives with: lives with their family Lives in: House/apartment  PLOF:  Level of assistance: Independent with ADLs Employment: Other: pt stated he is planning to retire, but would like to find some "part time" work in the near future   PATIENT GOALS   to return to PLOF; to be able to "debate"  OBJECTIVE:  TODAY'S TREATMENT:  Pt seen for cognitive-linguistic treatment with emphasis on attention, memory, and executive functioning. Reviewed factors that may impact divided attention/multitasking as well as compensations for divided attention/multitasking. Pt identified being tasks multipart questions as an area of difficulty. Reviewed verbiage for self-advocacy for improve success. Pt reports discussing this with wife and planning to implement when needed. Pt did not complete multitasking worksheet yet, but will focus on next week after he  comes from a trip. Reviewed hot topic/debates and strategies to improve success. Pt able to utilize strategies with rare cues. Pt stated he felt like his speech was less fluent while debating with SLP. This was not overtly appreciated by SLP. Pt continues with mild difficulty generating specifics of multiple viewpoints. Pt given list of topics to pick x2 to debate  for next session.  PATIENT EDUCATION: Education details: as above Person educated: Patient Education method: Proofreader out Education comprehension: verbalized understanding and needs further education  HOME EXERCISE PROGRAM:        Implementation of patient-selected compensations for attention, memory, executive functioning  Multitasking and debate homework    GOALS:  Goals reviewed with patient? Yes  SHORT TERM GOALS: Target date: 10 sessions  Pt will ID x2-3 strategies to improve functional attention, memory, and executive functioning with min A.  Baseline: Goal status: INITIAL   2.  Pt will report implementation of 1-2 strategies to improve functional attention, memory, and executive functioning. Baseline:  Goal status: INITIAL  3. In one-on-one discussions, the pt will respond to a differing opinion by restating the alternate viewpoint and offering a reasoned response (agreeing or disagreeing respectfully) in 3 out of 5 opportunities with cues as needed. Baseline:  Goal status: INITIAL  LONG TERM GOALS: Target date: 12 weeks  Pt will express understanding of ways to promote cognitive-linguistic functioning outside of therapy sessions.  Baseline:  Goal status: INITIAL  2.  Pt will improve reasoning and perspective-taking skills by generating multiple appropriate viewpoints in structured and unstructured tasks to enhance social communication with min cueing.  Baseline:  Goal status: INITIAL   ASSESSMENT:  CLINICAL IMPRESSION:  Patient is a 68 y.o. male who was seen today for cognitive-communication treatnebt in setting of stroke in January 2025. Of note, pt known to SLP from short course of ST services in which pt self-discharged. Pt reports improving cognitive-communication, but difficulty with attention, memory, and being able to "debate" or "banter" with friends. Assessment, 5/20, completed via formal assessment (Cognitive Linguistic Quick Test) and PROM  (Neuro-QOLT Item Bank v2.0-Cognition Function-Short Form). Pt with improved cognitive-communicative functioning per CLQT compared to 08/29/23; however, pt continues with mild cognitive-communication deficits affecting attention, memory, and executive functioning based on CLQT,  Neuro-QOLT Item Bank v2.0-Cognition Function-Short Form, and subjective reports. Suspect all domains of cognition are impacting by impaired sustained, alternating, and divided attention. See details of tx session above. Recommend course of ST services targeting above mentioned deficits.  OBJECTIVE IMPAIRMENTS include attention, memory, and executive functioning. These impairments are limiting patient from return to work and effectively communicating at home and in community. Factors affecting potential to achieve goals and functional outcome are none.. Patient will benefit from skilled SLP services to address above impairments and improve overall function.  REHAB POTENTIAL: Good  PLAN: SLP FREQUENCY: 1-2x/week  SLP DURATION: 12 weeks  PLANNED INTERVENTIONS: Cueing hierachy, Cognitive reorganization, Internal/external aids, Functional tasks, and SLP instruction and feedback   Dia Forget, M.S., CCC-SLP Speech-Language Pathologist [place] [facility] [phone] Rogers Clayman)   [place] [facility] at [facility] [address] [place], [place], [address] Phone: [phone]   Fax:  [phone]

## 2023-12-17 ENCOUNTER — Ambulatory Visit

## 2023-12-17 ENCOUNTER — Ambulatory Visit: Payer: 59

## 2023-12-19 ENCOUNTER — Ambulatory Visit: Payer: 59

## 2023-12-19 ENCOUNTER — Ambulatory Visit

## 2023-12-24 ENCOUNTER — Ambulatory Visit

## 2023-12-24 ENCOUNTER — Ambulatory Visit: Payer: 59

## 2023-12-26 ENCOUNTER — Ambulatory Visit: Payer: 59

## 2023-12-26 ENCOUNTER — Ambulatory Visit

## 2023-12-31 ENCOUNTER — Ambulatory Visit: Attending: Internal Medicine

## 2023-12-31 ENCOUNTER — Ambulatory Visit: Payer: 59

## 2023-12-31 DIAGNOSIS — R41841 Cognitive communication deficit: Secondary | ICD-10-CM | POA: Insufficient documentation

## 2023-12-31 NOTE — Therapy (Signed)
 OUTPATIENT SPEECH LANGUAGE PATHOLOGY  TREATMENT   Patient Name: Gordon Garcia MRN: [id number] DOB:1956-02-11, 68 y.o., male Today's Date: 12/31/2023  PCP: Alena Hush, MD  REFERRING PROVIDER: as Barbee Lew of Session - 12/31/23 1017     Visit Number 5    Number of Visits 24    Date for SLP Re-Evaluation 02/25/24    Authorization Type [id number] combined OT/SLP per year- 16 remain    Authorization - Visit Number 5    Authorization - Number of Visits 16    Progress Note Due on Visit 10    SLP Start Time 0845    SLP Stop Time  0930    SLP Time Calculation (min) 45 min          No past medical history on file. The histories are not reviewed yet. Please review them in the History navigator section and refresh this SmartLink. 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   Pt accompanied by: self  PERTINENT HISTORY: Gordon Garcia is a 68 y.o. male with medical history significant for paroxysmal a flutter off anticoagulation, HTN, chronic headache and recent hospitalization for stroke.   - In 08/10/23, patient was hospitalized for stroke with MRI brain that showed old infarcts and acute small vessel infarcts in  bilateral basal ganglia. - He presented initially with TIA symptoms on 1/25, MRI brain was normal and only showed old infarcts - On 1/28 he had similar symptoms, right sided weakness and word finding difficulty, went back to ED, and this time MRI showed acute stroke, 3 small spots in basal ganglia.  **Pt started OP ST 08/27/23 and self-discharged on 09/03/23.  DIAGNOSTIC FINDINGS: Most recent MRI, 09/07/22, Known acute infarct in the left basal ganglia and corona radiata, more confluent than seen on 08/13/2023 scan but still in the same distribution. No new territory infarct or interval hemorrhage.  PAIN:  Are you having pain? No   FALLS: Has patient fallen in last 6 months?  No  LIVING ENVIRONMENT: Lives with: lives with their family Lives in: House/apartment  PLOF:  Level of assistance: Independent with ADLs Employment: Other: pt stated he is planning to retire, but would like to find some part time work in the near future   PATIENT GOALS   to return to PLOF; to be able to debate  OBJECTIVE:  TODAY'S TREATMENT:  Pt seen for cognitive-linguistic treatment with emphasis on attention, memory, and executive functioning. Reviewed factors that may impact divided attention/multitasking as well as compensations for divided attention/multitasking. Pt identified being tasks multipart questions as an area of difficulty. Reviewed verbiage for self-advocacy for improve success. Pt reports discussing this with wife and planning to implement when needed. Reviewed hot topic/debates and strategies to improve success. Pt able to utilize strategies with rare  cues. Pt stated he felt like his speech was less fluent while debating with SLP. X1 instance of wordfinding difficulty. Pt continues with mild difficulty generating specifics of multiple viewpoints. Pt given list of topics to pick x1 to debate for next session. Reviewed factors that may affect attention, memory, executive functioning, and  wordfinding. Pt noted feeling more scattered since cardiac ablation which he attributes to changes to sleep and activity due to current restrictions. Supportive counseling provided as appropriate.   PATIENT EDUCATION: Education details: as above Person educated: Patient Education method: Proofreader out Education comprehension: verbalized understanding and needs further education  HOME EXERCISE PROGRAM:        Implementation of patient-selected compensations for attention, memory, executive functioning  Multitasking and debate homework    GOALS:  Goals reviewed with patient? Yes  SHORT TERM GOALS: Target date: 10 sessions  Pt will ID x2-3 strategies to improve functional attention, memory, and executive functioning with min A.  Baseline: Goal status: INITIAL   2.  Pt will report implementation of 1-2 strategies to improve functional attention, memory, and executive functioning. Baseline:  Goal status: INITIAL  3. In one-on-one discussions, the pt will respond to a differing opinion by restating the alternate viewpoint and offering a reasoned response (agreeing or disagreeing respectfully) in 3 out of 5 opportunities with cues as needed. Baseline:  Goal status: INITIAL  LONG TERM GOALS: Target date: 12 weeks  Pt will express understanding of ways to promote cognitive-linguistic functioning outside of therapy sessions.  Baseline:  Goal status: INITIAL  2.  Pt will improve reasoning and perspective-taking skills by generating multiple appropriate viewpoints in structured and unstructured tasks to enhance social communication with min cueing.  Baseline:  Goal status: INITIAL   ASSESSMENT:  CLINICAL IMPRESSION:  Patient is a 68 y.o. male who was seen today for cognitive-communication treatnebt in setting of stroke in January 2025. Of note, pt known to SLP from short course of ST services in which pt self-discharged. Pt reports improving cognitive-communication, but  difficulty with attention, memory, and being able to debate or banter with friends. Assessment, 5/20, completed via formal assessment (Cognitive Linguistic Quick Test) and PROM (Neuro-QOLT Item Bank v2.0-Cognition Function-Short Form). Pt with improved cognitive-communicative functioning per CLQT compared to 08/29/23; however, pt continues with mild cognitive-communication deficits affecting attention, memory, and executive functioning based on CLQT,  Neuro-QOLT Item Bank v2.0-Cognition Function-Short Form, and subjective reports. Suspect all domains of cognition are impacting by impaired sustained, alternating, and divided attention. See details of tx session above. Recommend course of ST services targeting above mentioned deficits.  OBJECTIVE IMPAIRMENTS include attention, memory, and executive functioning. These impairments are limiting patient from return to work and effectively communicating at home and in community. Factors affecting potential to achieve goals and functional outcome are none.. Patient will benefit from skilled SLP services to address above impairments and improve overall function.  REHAB POTENTIAL: Good  PLAN: SLP FREQUENCY: 1-2x/week  SLP DURATION: 12 weeks  PLANNED INTERVENTIONS: Cueing hierachy, Cognitive reorganization, Internal/external aids, Functional tasks, and SLP instruction and feedback   Dia Forget, M.S., CCC-SLP Speech-Language Pathologist [place] - [facility] [phone] Rogers Clayman)   [facility] Outpatient Rehabilitation at [facility] [address] [place], [place], [address] Phone: [phone]   Fax:  [phone]

## 2024-01-02 ENCOUNTER — Ambulatory Visit: Payer: 59

## 2024-01-02 ENCOUNTER — Ambulatory Visit

## 2024-01-02 DIAGNOSIS — R41841 Cognitive communication deficit: Secondary | ICD-10-CM

## 2024-01-02 NOTE — Therapy (Signed)
 OUTPATIENT SPEECH LANGUAGE PATHOLOGY  TREATMENT   Patient Name: Gordon Garcia MRN: [id number] DOB:[birth year], 68 y.o., male Today's Date: 01/02/2024  PCP: Alena Hush, MD  REFERRING PROVIDER: as Barbee Lew of Session - 01/02/24 1021     Visit Number 6    Number of Visits 24    Date for SLP Re-Evaluation 02/25/24    Authorization Type [id number] combined OT/SLP per year- 16 remain    Authorization - Visit Number 6    Authorization - Number of Visits 16    Progress Note Due on Visit 10    SLP Start Time [phone]    SLP Stop Time  1020    SLP Time Calculation (min) 55 min    Activity Tolerance Patient tolerated treatment well          No past medical history on file. The histories are not reviewed yet. Please review them in the History navigator section and refresh this SmartLink. 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   Pt accompanied by: self  PERTINENT HISTORY: Gordon Garcia is a 68 y.o. male with medical history significant for paroxysmal a flutter off anticoagulation, HTN, chronic headache and recent hospitalization for stroke.   - In 08/10/23, patient was hospitalized for stroke with MRI brain that  showed old infarcts and acute small vessel infarcts in bilateral basal ganglia. - He presented initially with TIA symptoms on 1/25, MRI brain was normal and only showed old infarcts - On 1/28 he had similar symptoms, right sided weakness and word finding difficulty, went back to ED, and this time MRI showed acute stroke, 3 small spots in basal ganglia.  **Pt started OP ST 08/27/23 and self-discharged on 09/03/23.  DIAGNOSTIC FINDINGS: Most recent MRI, 09/07/22, Known acute infarct in the left basal ganglia and corona radiata, more confluent than seen on 08/13/2023 scan but still in the same distribution. No new territory infarct or interval hemorrhage.  PAIN:  Are you having pain? No   FALLS: Has patient fallen in last 6 months?  No  LIVING ENVIRONMENT: Lives with: lives with their family Lives in: House/apartment  PLOF:  Level of assistance: Independent with ADLs Employment: Other: pt stated he is planning to retire, but would like to find some part time work in the near future   PATIENT GOALS   to return to PLOF; to be able to debate  OBJECTIVE:  TODAY'S TREATMENT:  Pt seen for cognitive-linguistic treatment with emphasis on attention, memory, and executive functioning. Reviewed factors that may impact divided attention/multitasking as well as compensations for divided attention/multitasking. Reviewed hot topic/debates and strategies to improve success. Pt able to utilize strategies with rare cues. Pt stated he felt like his speech was less fluent while debating with SLP. X1 instance of wordfinding difficulty. Pt  continues with mild difficulty generating specifics of multiple viewpoints occasionally requiring extra time to stop and think vs trying to think and talk simultaneously.   PATIENT EDUCATION: Education details: as above Person educated: Patient Education method: Proofreader out Education comprehension: verbalized understanding and needs further  education  HOME EXERCISE PROGRAM:        Implementation of patient-selected compensations for attention, memory, executive functioning  Multitasking and debate homework    GOALS:  Goals reviewed with patient? Yes  SHORT TERM GOALS: Target date: 10 sessions  Pt will ID x2-3 strategies to improve functional attention, memory, and executive functioning with min A.  Baseline: Goal status: INITIAL   2.  Pt will report implementation of 1-2 strategies to improve functional attention, memory, and executive functioning. Baseline:  Goal status: INITIAL  3. In one-on-one discussions, the pt will respond to a differing opinion by restating the alternate viewpoint and offering a reasoned response (agreeing or disagreeing respectfully) in 3 out of 5 opportunities with cues as needed. Baseline:  Goal status: INITIAL  LONG TERM GOALS: Target date: 12 weeks  Pt will express understanding of ways to promote cognitive-linguistic functioning outside of therapy sessions.  Baseline:  Goal status: INITIAL  2.  Pt will improve reasoning and perspective-taking skills by generating multiple appropriate viewpoints in structured and unstructured tasks to enhance social communication with min cueing.  Baseline:  Goal status: INITIAL   ASSESSMENT:  CLINICAL IMPRESSION:  Patient is a 68 y.o. male who was seen today for cognitive-communication treatnebt in setting of stroke in January 2025. Of note, pt known to SLP from short course of ST services in which pt self-discharged. Pt reports improving cognitive-communication, but difficulty with attention, memory, and being able to debate or banter with friends. Assessment, 5/20, completed via formal assessment (Cognitive Linguistic Quick Test) and PROM (Neuro-QOLT Item Bank v2.0-Cognition Function-Short Form). Pt with improved cognitive-communicative functioning per CLQT compared to 08/29/23; however, pt continues with mild cognitive-communication deficits  affecting attention, memory, and executive functioning based on CLQT,  Neuro-QOLT Item Bank v2.0-Cognition Function-Short Form, and subjective reports. Suspect all domains of cognition are impacting by impaired sustained, alternating, and divided attention. See details of tx session above. Recommend course of ST services targeting above mentioned deficits.  OBJECTIVE IMPAIRMENTS include attention, memory, and executive functioning. These impairments are limiting patient from return to work and effectively communicating at home and in community. Factors affecting potential to achieve goals and functional outcome are none.. Patient will benefit from skilled SLP services to address above impairments and improve overall function.  REHAB POTENTIAL: Good  PLAN: SLP FREQUENCY: 1-2x/week  SLP DURATION: 12 weeks  PLANNED INTERVENTIONS: Cueing hierachy, Cognitive reorganization, Internal/external aids, Functional tasks, and SLP instruction and feedback   Dia Forget, M.S., CCC-SLP Speech-Language Pathologist [place] - [facility] [phone] Rogers Clayman)   [facility] [place] Out[facility] Rehabilitation at [facility] [address] [place], [place], [address] Phone: [phone]   Fax:  [phone]

## 2024-01-07 ENCOUNTER — Ambulatory Visit: Payer: 59

## 2024-01-07 ENCOUNTER — Ambulatory Visit

## 2024-01-07 DIAGNOSIS — R41841 Cognitive communication deficit: Secondary | ICD-10-CM

## 2024-01-07 NOTE — Therapy (Signed)
 OUTPATIENT SPEECH LANGUAGE PATHOLOGY  TREATMENT   Patient Name: Gordon Garcia MRN: [id number] DOB:[birth year], 68 y.o., male Today's Date: 01/07/2024  PCP: Doretta Button, MD  REFERRING PROVIDER: as lujean Hanson of Session - 01/07/24 0926     Visit Number 7    Number of Visits 24    Date for SLP Re-Evaluation 02/25/24    Authorization Type [id number] combined OT/SLP per year- 16 remain    Authorization - Visit Number 7    Authorization - Number of Visits 16    SLP Start Time 0930    SLP Stop Time  1015    SLP Time Calculation (min) 45 min    Activity Tolerance Patient tolerated treatment well          No past medical history on file. The histories are not reviewed yet. Please review them in the History navigator section and refresh this SmartLink. 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   Pt accompanied by: self  PERTINENT HISTORY: Gordon Garcia is a 68 y.o. male with medical history significant for paroxysmal a flutter off anticoagulation, HTN, chronic headache and recent hospitalization for stroke.   - In 08/10/23, patient was hospitalized for stroke with MRI brain that showed old infarcts and acute  small vessel infarcts in bilateral basal ganglia. - He presented initially with TIA symptoms on 1/25, MRI brain was normal and only showed old infarcts - On 1/28 he had similar symptoms, right sided weakness and word finding difficulty, went back to ED, and this time MRI showed acute stroke, 3 small spots in basal ganglia.  **Pt started OP ST 08/27/23 and self-discharged on 09/03/23.  DIAGNOSTIC FINDINGS: Most recent MRI, 09/07/22, Known acute infarct in the left basal ganglia and corona radiata, more confluent than seen on 08/13/2023 scan but still in the same distribution. No new territory infarct or interval hemorrhage.  PAIN:  Are you having pain? No   FALLS: Has patient fallen in last 6 months?  No  LIVING ENVIRONMENT: Lives with: lives with their family Lives in: House/apartment  PLOF:  Level of assistance: Independent with ADLs Employment: Other: pt stated he is planning to retire, but would like to find some part time work in the near future   PATIENT GOALS   to return to PLOF; to be able to debate  OBJECTIVE:  TODAY'S TREATMENT:  Pt seen for cognitive-linguistic treatment with emphasis on attention, memory, and executive functioning. Pt endorsed family commenting on changes to personality and behavior. Pt educated re: changes to personality and behavior in basal ganglia stroke. Hand out provided to share with family. Supportive counseling provided as appropriate. Discussed compensations for attention, memory, and executive functioning in setting upcoming church event.   PATIENT EDUCATION: Education details: as above Person educated: Patient Education  method: Explanation; Hand out Education comprehension: verbalized understanding and needs further education  HOME EXERCISE PROGRAM:        Implementation of patient-selected compensations for attention, memory, executive functioning  Share hand out with family   GOALS:  Goals reviewed with patient? Yes  SHORT  TERM GOALS: Target date: 10 sessions  Pt will ID x2-3 strategies to improve functional attention, memory, and executive functioning with min A.  Baseline: Goal status: INITIAL   2.  Pt will report implementation of 1-2 strategies to improve functional attention, memory, and executive functioning. Baseline:  Goal status: INITIAL  3. In one-on-one discussions, the pt will respond to a differing opinion by restating the alternate viewpoint and offering a reasoned response (agreeing or disagreeing respectfully) in 3 out of 5 opportunities with cues as needed. Baseline:  Goal status: INITIAL  LONG TERM GOALS: Target date: 12 weeks  Pt will express understanding of ways to promote cognitive-linguistic functioning outside of therapy sessions.  Baseline:  Goal status: INITIAL  2.  Pt will improve reasoning and perspective-taking skills by generating multiple appropriate viewpoints in structured and unstructured tasks to enhance social communication with min cueing.  Baseline:  Goal status: INITIAL   ASSESSMENT:  CLINICAL IMPRESSION:  Patient is a 68 y.o. male who was seen today for cognitive-communication treatnebt in setting of stroke in January 2025. Of note, pt known to SLP from short course of ST services in which pt self-discharged. Pt reports improving cognitive-communication, but difficulty with attention, memory, and being able to debate or banter with friends. Assessment, 5/20, completed via formal assessment (Cognitive Linguistic Quick Test) and PROM (Neuro-QOLT Item Bank v2.0-Cognition Function-Short Form). Pt with improved cognitive-communicative functioning per CLQT compared to 08/29/23; however, pt continues with mild cognitive-communication deficits affecting attention, memory, and executive functioning based on CLQT,  Neuro-QOLT Item Bank v2.0-Cognition Function-Short Form, and subjective reports. Suspect all domains of cognition are impacting by impaired sustained,  alternating, and divided attention. See details of tx session above. Recommend course of ST services targeting above mentioned deficits.  OBJECTIVE IMPAIRMENTS include attention, memory, and executive functioning. These impairments are limiting patient from return to work and effectively communicating at home and in community. Factors affecting potential to achieve goals and functional outcome are none.. Patient will benefit from skilled SLP services to address above impairments and improve overall function.  REHAB POTENTIAL: Good  PLAN: SLP FREQUENCY: 1-2x/week  SLP DURATION: 12 weeks  PLANNED INTERVENTIONS: Cueing hierachy, Cognitive reorganization, Internal/external aids, Functional tasks, and SLP instruction and feedback   Delon Bangs, M.S., CCC-SLP Speech-Language Pathologist [place] [facility] [phone] [place])   [facility] [address] [place], [place], [address] Phone: [phone]   Fax:  [phone]

## 2024-01-09 ENCOUNTER — Ambulatory Visit

## 2024-01-09 ENCOUNTER — Ambulatory Visit: Payer: 59

## 2024-01-14 ENCOUNTER — Ambulatory Visit

## 2024-01-14 ENCOUNTER — Ambulatory Visit: Payer: 59

## 2024-01-16 ENCOUNTER — Ambulatory Visit: Attending: Internal Medicine

## 2024-01-16 ENCOUNTER — Ambulatory Visit: Payer: 59

## 2024-01-16 DIAGNOSIS — R41841 Cognitive communication deficit: Secondary | ICD-10-CM | POA: Insufficient documentation

## 2024-01-16 NOTE — Therapy (Signed)
 OUTPATIENT SPEECH LANGUAGE PATHOLOGY  TREATMENT   Patient Name: Gordon Garcia MRN: [id number] DOB:Jul 07, 1956, 68 y.o., male Today's Date: 01/16/2024  PCP: Doretta Button, MD  REFERRING PROVIDER: as lujean Hanson of Session - 01/16/24 0928     Visit Number 8    Number of Visits 24    Date for SLP Re-Evaluation 02/25/24    Authorization Type [id number] combined OT/SLP per year- 16 remain    Authorization - Visit Number 8    Authorization - Number of Visits 16    Progress Note Due on Visit 10    SLP Start Time 0930    SLP Stop Time  1015    SLP Time Calculation (min) 45 min    Activity Tolerance Patient tolerated treatment well          No past medical history on file. The histories are not reviewed yet. Please review them in the History navigator section and refresh this SmartLink. 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   Pt accompanied by: self  PERTINENT HISTORY: Gordon Garcia is a 68 y.o. male with medical history significant for paroxysmal a flutter off anticoagulation, HTN, chronic headache and recent hospitalization for stroke.   - In 08/10/23, patient was hospitalized for stroke with MRI brain that  showed old infarcts and acute small vessel infarcts in bilateral basal ganglia. - He presented initially with TIA symptoms on 1/25, MRI brain was normal and only showed old infarcts - On 1/28 he had similar symptoms, right sided weakness and word finding difficulty, went back to ED, and this time MRI showed acute stroke, 3 small spots in basal ganglia.  **Pt started OP ST 08/27/23 and self-discharged on 09/03/23.  DIAGNOSTIC FINDINGS: Most recent MRI, 09/07/22, Known acute infarct in the left basal ganglia and corona radiata, more confluent than seen on 08/13/2023 scan but still in the same distribution. No new territory infarct or interval hemorrhage.  PAIN:  Are you having pain? No   FALLS: Has patient fallen in last 6 months?  No  LIVING ENVIRONMENT: Lives with: lives with their family Lives in: House/apartment  PLOF:  Level of assistance: Independent with ADLs Employment: Other: pt stated he is planning to retire, but would like to find some part time work in the near future   PATIENT GOALS   to return to PLOF; to be able to debate  OBJECTIVE:  TODAY'S TREATMENT:  Pt seen for cognitive-linguistic treatment with emphasis on attention, memory, and executive functioning. Pt endorsed family commenting on changes to personality and behavior. Reviewed re: changes to personality and behavior in basal ganglia stroke. Hand out provided to share with family last week, and pt reports sharing it with wife. Discussed explaining intent of behaviors (e.g. pt stated staring means he's processing) as  well as asking specific information about what apathy and irritability look like to his communication partners. Supportive counseling provided as appropriate. Pt endorsed successful implementation of compensations for attention, memory, and executive functioning in setting recent church event.   PATIENT EDUCATION: Education details: as above Person educated: Patient Education method:  Proofreader out Education comprehension: verbalized understanding and needs further education  HOME EXERCISE PROGRAM:        Implementation of patient-selected compensations for attention, memory, executive functioning  Share hand out with family   GOALS:  Goals reviewed with patient? Yes  SHORT TERM GOALS: Target date: 10 sessions  Pt will ID x2-3 strategies to improve functional attention, memory, and executive functioning with min A.  Baseline: Goal status: INITIAL   2.  Pt will report implementation of 1-2 strategies to improve functional attention, memory, and executive functioning. Baseline:  Goal status: INITIAL  3. In one-on-one discussions, the pt will respond to a differing opinion by restating the alternate viewpoint and offering a reasoned response (agreeing or disagreeing respectfully) in 3 out of 5 opportunities with cues as needed. Baseline:  Goal status: INITIAL  LONG TERM GOALS: Target date: 12 weeks  Pt will express understanding of ways to promote cognitive-linguistic functioning outside of therapy sessions.  Baseline:  Goal status: INITIAL  2.  Pt will improve reasoning and perspective-taking skills by generating multiple appropriate viewpoints in structured and unstructured tasks to enhance social communication with min cueing.  Baseline:  Goal status: INITIAL   ASSESSMENT:  CLINICAL IMPRESSION:  Patient is a 68 y.o. male who was seen today for cognitive-communication treatnebt in setting of stroke in January 2025. Of note, pt known to SLP from short course of ST services in which pt self-discharged. Pt reports improving cognitive-communication, but difficulty with attention, memory, and being able to debate or banter with friends. Assessment, 5/20, completed via formal assessment (Cognitive Linguistic Quick Test) and PROM (Neuro-QOLT Item Bank v2.0-Cognition Function-Short Form). Pt with improved cognitive-communicative functioning per CLQT  compared to 08/29/23; however, pt continues with mild cognitive-communication deficits affecting attention, memory, and executive functioning based on CLQT,  Neuro-QOLT Item Bank v2.0-Cognition Function-Short Form, and subjective reports. Suspect all domains of cognition are impacting by impaired sustained, alternating, and divided attention. See details of tx session above. Recommend course of ST services targeting above mentioned deficits.  OBJECTIVE IMPAIRMENTS include attention, memory, and executive functioning. These impairments are limiting patient from return to work and effectively communicating at home and in community. Factors affecting potential to achieve goals and functional outcome are none.. Patient will benefit from skilled SLP services to address above impairments and improve overall function.  REHAB POTENTIAL: Good  PLAN: SLP FREQUENCY: 1-2x/week  SLP DURATION: 12 weeks  PLANNED INTERVENTIONS: Cueing hierachy, Cognitive reorganization, Internal/external aids, Functional tasks, and SLP instruction and feedback   Delon Bangs, M.S., CCC-SLP Speech-Language Pathologist [place] [facility] [phone] [place])   [facility] [address] [place], [place], [address] Phone: [phone]   Fax:  [phone]

## 2024-01-21 ENCOUNTER — Ambulatory Visit: Payer: 59

## 2024-01-21 ENCOUNTER — Ambulatory Visit

## 2024-01-23 ENCOUNTER — Ambulatory Visit: Payer: 59

## 2024-01-23 ENCOUNTER — Ambulatory Visit

## 2024-01-28 ENCOUNTER — Ambulatory Visit

## 2024-01-28 ENCOUNTER — Ambulatory Visit: Payer: 59

## 2024-01-30 ENCOUNTER — Ambulatory Visit: Payer: 59

## 2024-01-30 ENCOUNTER — Ambulatory Visit

## 2024-02-04 ENCOUNTER — Ambulatory Visit: Payer: 59

## 2024-02-04 ENCOUNTER — Ambulatory Visit

## 2024-02-06 ENCOUNTER — Ambulatory Visit: Payer: 59

## 2024-02-11 ENCOUNTER — Encounter

## 2024-02-13 ENCOUNTER — Encounter

## 2024-02-18 ENCOUNTER — Encounter

## 2024-02-20 ENCOUNTER — Encounter

## 2024-02-25 ENCOUNTER — Encounter

## 2024-02-27 ENCOUNTER — Encounter

## 2024-03-03 ENCOUNTER — Encounter

## 2024-03-05 ENCOUNTER — Encounter

## 2024-03-10 ENCOUNTER — Encounter

## 2024-03-12 ENCOUNTER — Encounter

## 2024-03-17 ENCOUNTER — Encounter

## 2024-03-19 ENCOUNTER — Encounter

## 2024-03-24 ENCOUNTER — Encounter

## 2024-03-26 ENCOUNTER — Encounter

## 2024-03-31 ENCOUNTER — Encounter

## 2024-04-02 ENCOUNTER — Encounter

## 2024-04-07 ENCOUNTER — Encounter

## 2024-05-25 ENCOUNTER — Ambulatory Visit: Admitting: Urology

## 2024-06-09 ENCOUNTER — Ambulatory Visit: Admitting: Urology

## 2024-06-09 ENCOUNTER — Encounter: Payer: Self-pay | Admitting: Urology

## 2024-06-09 VITALS — BP 162/74 | HR 64 | Ht 69.0 in | Wt 180.0 lb

## 2024-06-09 DIAGNOSIS — N486 Induration penis plastica: Secondary | ICD-10-CM | POA: Diagnosis not present

## 2024-06-09 DIAGNOSIS — N521 Erectile dysfunction due to diseases classified elsewhere: Secondary | ICD-10-CM | POA: Diagnosis not present

## 2024-06-09 DIAGNOSIS — N401 Enlarged prostate with lower urinary tract symptoms: Secondary | ICD-10-CM

## 2024-06-09 LAB — BLADDER SCAN AMB NON-IMAGING: Scan Result: 225

## 2024-06-09 MED ORDER — TADALAFIL 20 MG PO TABS: ORAL_TABLET | ORAL | 0 refills | Status: AC

## 2024-06-09 MED ORDER — TAMSULOSIN HCL 0.4 MG PO CAPS: 0.4000 mg | ORAL_CAPSULE | Freq: Every day | ORAL | 1 refills | Status: DC

## 2024-06-09 NOTE — Progress Notes (Unsigned)
 06/09/2024 5:09 PM   Gordon Garcia 26-Aug-1955 [id number]  Referring provider: Jeffie Cheryl BRAVO, MD [address] [place],  [place] [address]  Chief Complaint  Patient presents with   Erectile Dysfunction   Urologic history: 1.  Peyronie's disease  2.  Erectile dysfunction Tadalafil  20 mg   HPI: Gordon Garcia is a 68 y.o. male presents for reestablishment office visit.  Initially seen 02/17/2019 4 Peyronie's disease and erectile dysfunction.  Complained of mid shaft curvature to the left estimated at 45 degrees.  + hourglass deformity.  Initially elected observation and on follow-up 09/02/2019 he was claiming of stable curvature but mild to moderate ED which was improved with tadalafil  States his curvature is stable though tadalafil  is no longer effective and he is having a significant headache He has also developed bothersome lower urinary tract symptoms over the last 2 years with his most bothersome symptoms consisting of urinary frequency, urgency and occasional urge incontinence No dysuria or gross hematuria No flank, abdominal or pelvic pain PSA 05/06/2024 stable at 2.74   PMH: Past Medical History:  Diagnosis Date   Allergic rhinitis    Atrial flutter (HCC)    GERD (gastroesophageal reflux disease)    HTN (hypertension)     Surgical History: Past Surgical History:  Procedure Laterality Date   COLONOSCOPY WITH PROPOFOL  N/A 02/14/2021   Procedure: COLONOSCOPY WITH PROPOFOL ;  Surgeon: Jinny Carmine, MD;  Location: [facility] ENDOSCOPY;  Service: Endoscopy;  Laterality: N/A;    Home Medications:  Allergies as of 06/09/2024       Reactions   Codeine    Other reaction(s): Unknown        Medication List        Accurate as of June 09, 2024  5:09 PM. If you have any questions, ask your nurse or doctor.          STOP taking these medications    butalbital -acetaminophen -caffeine  50-325-40 MG tablet Commonly known as: FIORICET  Stopped by: Glendia JAYSON Barba    hydrALAZINE  50 MG tablet Commonly known as: APRESOLINE  Stopped by: Glendia JAYSON Barba   methocarbamol  500 MG tablet Commonly known as: ROBAXIN  Stopped by: Glendia JAYSON Barba   rOPINIRole  0.5 MG tablet Commonly known as: REQUIP  Stopped by: Glendia JAYSON Barba       TAKE these medications    acetaminophen  325 MG tablet Commonly known as: TYLENOL  Take 650 mg by mouth every 6 (six) hours as needed.   apixaban  5 MG Tabs tablet Commonly known as: ELIQUIS  Take 1 tablet (5 mg total) by mouth 2 (two) times daily.   atorvastatin  80 MG tablet Commonly known as: LIPITOR Take 1 tablet (80 mg total) by mouth daily.   tadalafil  20 MG tablet Commonly known as: CIALIS  1 tablet by mouth 1 hour prior to intercourse   tamsulosin  0.4 MG Caps capsule Commonly known as: FLOMAX  Take 1 capsule (0.4 mg total) by mouth daily. Started by: Glendia JAYSON Barba        Allergies:  Allergies  Allergen Reactions   Codeine     Other reaction(s): Unknown    Family History: History reviewed. No pertinent family history.  Social History:  reports that he has been smoking cigars. He has never used smokeless tobacco. He reports current alcohol use. He reports that he does not use drugs.   Physical Exam: BP (!) 162/74   Pulse 64   Ht 5' 9 (1.753 m)   Wt 180 lb (81.6 kg)   BMI 26.58  kg/m   Constitutional:  Alert, No acute distress. HEENT: [place] AT Respiratory: Normal respiratory effort, no increased work of breathing. Psychiatric: Normal mood and affect.    Assessment & Plan:    1.  BPH with LUTS Primarily storage related voiding symptoms PVR today 225 mL Trial tamsulosin  0.4 mg daily PA follow-up 1 month for symptom recheck and PVR  2.  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  He was provided literature on penile implant and assisted in pursuing will schedule an appointment with Dr. Lovie in [place] ***Follow-up PA 1 month with PVR   Glendia JAYSON Barba, MD  [facility] [address], [address] [place], [place] [address] [phone]

## 2024-06-19 ENCOUNTER — Encounter: Payer: Self-pay | Admitting: Urology

## 2024-07-14 ENCOUNTER — Ambulatory Visit: Admitting: Physician Assistant

## 2024-07-14 VITALS — BP 144/89 | HR 78 | Ht 69.0 in | Wt 183.0 lb

## 2024-07-14 DIAGNOSIS — N486 Induration penis plastica: Secondary | ICD-10-CM

## 2024-07-14 DIAGNOSIS — N521 Erectile dysfunction due to diseases classified elsewhere: Secondary | ICD-10-CM | POA: Diagnosis not present

## 2024-07-14 DIAGNOSIS — N401 Enlarged prostate with lower urinary tract symptoms: Secondary | ICD-10-CM

## 2024-07-14 DIAGNOSIS — R3914 Feeling of incomplete bladder emptying: Secondary | ICD-10-CM | POA: Diagnosis not present

## 2024-07-14 LAB — BLADDER SCAN AMB NON-IMAGING

## 2024-07-14 MED ORDER — TAMSULOSIN HCL 0.4 MG PO CAPS: 0.4000 mg | ORAL_CAPSULE | Freq: Every day | ORAL | 11 refills | Status: AC

## 2024-07-14 NOTE — Progress Notes (Signed)
 "  07/14/2024 10:23 AM   KMARION RAWL 1955/12/16 [id number]  CC: Chief Complaint  Patient presents with   Benign Prostatic Hypertrophy   Follow-up   HPI: Gordon Garcia is a 68 y.o. male with PMH Peyronie's disease, ED who failed tadalafil , and BPH with LUTS and incomplete emptying who presents today for symptom recheck on Flomax .   Today he reports improvement in his voiding symptoms on Flomax .  He describes decreased urgency and improved weak stream.  He is pleased and tolerating the medication well without orthostasis.  He denies dysuria or bladder discomfort.  With regard to his Peyronie's and ED, he has elected to defer IPP placement for now.  He does not wish to try alternative pharmacotherapy.  IPSS 7/pleased as below.  PVR 315 mL, previously 225 mL.   IPSS     Row Name 07/14/24 1000         International Prostate Symptom Score   How often have you had the sensation of not emptying your bladder? Less than 1 in 5     How often have you had to urinate less than every two hours? Less than 1 in 5 times     How often have you found you stopped and started again several times when you urinated? Less than 1 in 5 times     How often have you found it difficult to postpone urination? Less than 1 in 5 times     How often have you had a weak urinary stream? Less than 1 in 5 times     How often have you had to strain to start urination? Not at All     How many times did you typically get up at night to urinate? 2 Times     Total IPSS Score 7       Quality of Life due to urinary symptoms   If you were to spend the rest of your life with your urinary condition just the way it is now how would you feel about that? Pleased         PMH: Past Medical History:  Diagnosis Date   Allergic rhinitis    Atrial flutter (HCC)    GERD (gastroesophageal reflux disease)    HTN (hypertension)     Surgical History: Past Surgical History:  Procedure Laterality Date   COLONOSCOPY WITH PROPOFOL   N/A 02/14/2021   Procedure: COLONOSCOPY WITH PROPOFOL ;  Surgeon: Jinny Carmine, MD;  Location: [facility] ENDOSCOPY;  Service: Endoscopy;  Laterality: N/A;    Home Medications:  Allergies as of 07/14/2024       Reactions   Codeine    Other reaction(s): Unknown        Medication List        Accurate as of July 14, 2024 10:23 AM. If you have any questions, ask your nurse or doctor.          acetaminophen  325 MG tablet Commonly known as: TYLENOL  Take 650 mg by mouth every 6 (six) hours as needed.   apixaban  5 MG Tabs tablet Commonly known as: ELIQUIS  Take 1 tablet (5 mg total) by mouth 2 (two) times daily.   atorvastatin  80 MG tablet Commonly known as: LIPITOR Take 1 tablet (80 mg total) by mouth daily.   losartan 50 MG tablet Commonly known as: COZAAR Take 50 mg by mouth daily.   tadalafil  20 MG tablet Commonly known as: CIALIS  1 tablet by mouth 1 hour prior to intercourse   tamsulosin  0.4  MG Caps capsule Commonly known as: FLOMAX  Take 1 capsule (0.4 mg total) by mouth daily.   traZODone 50 MG tablet Commonly known as: DESYREL Take 25-50 mg by mouth at bedtime as needed.        Allergies:  Allergies[1]  Family History: No family history on file.  Social History:   reports that he has been smoking cigars. He has never used smokeless tobacco. He reports current alcohol use. He reports that he does not use drugs.  Physical Exam: BP (!) 144/89 (BP Location: Left Arm, Patient Position: Sitting, Cuff Size: Normal)   Pulse 78   Ht 5' 9 (1.753 m)   Wt 183 lb (83 kg)   SpO2 98%   BMI 27.02 kg/m   Constitutional:  Alert and oriented, no acute distress, nontoxic appearing HEENT: [place], AT Cardiovascular: No clubbing, cyanosis, or edema Respiratory: Normal respiratory effort, no increased work of breathing Skin: No rashes, bruises or suspicious lesions Neurologic: Grossly intact, no focal deficits, moving all 4 extremities Psychiatric: Normal mood and  affect  Laboratory Data: Results for orders placed or performed in visit on 07/14/24  Bladder Scan (Post Void Residual) in office   Collection Time: 07/14/24 10:19 AM  Result Value Ref Range   Scan Result    Assessment & Plan:   1. Benign prostatic hyperplasia with incomplete bladder emptying (Primary) Significant symptomatic improvement on Flomax , which he is tolerating well without orthostasis.  PVR remains elevated, though he is asymptomatic of this.  We discussed consideration of outlet procedures in the future if his residual rises or if he develops recurrent UTI, bladder discomfort, or progressive LUTS.  Will continue to monitor for now. - Bladder Scan (Post Void Residual) in office - tamsulosin  (FLOMAX ) 0.4 MG CAPS capsule; Take 1 capsule (0.4 mg total) by mouth daily.  Dispense: 30 capsule; Refill: 11  2. Peyronie's disease Deferring IPP placement, may revisit in the future per patient preference.  3. Erectile dysfunction due to diseases classified elsewhere Deferring IPP placement, may revisit in the future per patient preference.  Return in about 1 year (around 07/14/2025) for Annual IPSS/PVR/DRE.  Lucie Hones, PA-C  [facility] [address], [address] [place], [place] [address] [phone]     [1]  Allergies Allergen Reactions   Codeine     Other reaction(s): Unknown   "

## 2024-08-21 ENCOUNTER — Encounter: Payer: Self-pay | Admitting: Emergency Medicine

## 2024-08-21 ENCOUNTER — Other Ambulatory Visit: Payer: Self-pay

## 2024-08-21 ENCOUNTER — Emergency Department
Admission: EM | Admit: 2024-08-21 | Discharge: 2024-08-21 | Disposition: A | Discharge status: Home / Self Care | Source: Home / Self Care | Attending: Emergency Medicine | Admitting: Emergency Medicine

## 2024-08-21 DIAGNOSIS — R197 Diarrhea, unspecified: Secondary | ICD-10-CM

## 2024-08-21 DIAGNOSIS — E162 Hypoglycemia, unspecified: Secondary | ICD-10-CM

## 2024-08-21 LAB — URINALYSIS, ROUTINE W REFLEX MICROSCOPIC
Bacteria, UA: NONE SEEN
Bilirubin Urine: NEGATIVE
Glucose, UA: NEGATIVE mg/dL
Hgb urine dipstick: NEGATIVE
Ketones, ur: NEGATIVE mg/dL
Leukocytes,Ua: NEGATIVE
Nitrite: NEGATIVE
Protein, ur: NEGATIVE mg/dL
Specific Gravity, Urine: 1.003 — ABNORMAL LOW (ref 1.005–1.030)
Squamous Epithelial / HPF: 0 /HPF (ref 0–5)
pH: 5 (ref 5.0–8.0)

## 2024-08-21 LAB — COMPREHENSIVE METABOLIC PANEL WITH GFR
ALT: 37 U/L (ref 0–44)
AST: 30 U/L (ref 15–41)
Albumin: 4.6 g/dL (ref 3.5–5.0)
Alkaline Phosphatase: 77 U/L (ref 38–126)
Anion gap: 14 (ref 5–15)
BUN: 20 mg/dL (ref 8–23)
CO2: 23 mmol/L (ref 22–32)
Calcium: 9.7 mg/dL (ref 8.9–10.3)
Chloride: 102 mmol/L (ref 98–111)
Creatinine, Ser: 1.11 mg/dL (ref 0.61–1.24)
GFR, Estimated: >60 mL/min
Glucose, Bld: 139 mg/dL — ABNORMAL HIGH (ref 70–99)
Potassium: 3.9 mmol/L (ref 3.5–5.1)
Sodium: 138 mmol/L (ref 135–145)
Total Bilirubin: 1 mg/dL (ref 0.0–1.2)
Total Protein: 7.3 g/dL (ref 6.5–8.1)

## 2024-08-21 LAB — CBC
HCT: 41.7 % (ref 39.0–52.0)
Hemoglobin: 14.4 g/dL (ref 13.0–17.0)
MCH: 31.4 pg (ref 26.0–34.0)
MCHC: 34.5 g/dL (ref 30.0–36.0)
MCV: 91 fL (ref 80.0–100.0)
Platelets: 284 10*3/uL (ref 150–400)
RBC: 4.58 MIL/uL (ref 4.22–5.81)
RDW: 12.5 % (ref 11.5–15.5)
WBC: 5.2 10*3/uL (ref 4.0–10.5)
nRBC: 0 % (ref 0.0–0.2)

## 2024-08-21 LAB — CBG MONITORING, ED
Glucose-Capillary: 142 mg/dL — ABNORMAL HIGH (ref 70–99)
Glucose-Capillary: 104 mg/dL — ABNORMAL HIGH (ref 70–99)

## 2024-08-21 LAB — MAGNESIUM: Magnesium: 2.2 mg/dL (ref 1.7–2.4)

## 2024-08-21 LAB — CK: Total CK: 194 U/L (ref 49–397)

## 2024-08-21 LAB — LIPASE, BLOOD: Lipase: 43 U/L (ref 11–51)

## 2024-08-21 LAB — PHOSPHORUS: Phosphorus: 3.2 mg/dL (ref 2.5–4.6)

## 2024-08-21 MED ORDER — ACETAMINOPHEN 500 MG PO TABS
1000.0000 mg | ORAL_TABLET | Freq: Once | ORAL | Status: DC
  Filled 2024-08-21: qty 2

## 2024-08-21 NOTE — ED Triage Notes (Signed)
 PT complains of diarrhea for a few days, has been shivering this morning at 830, Blood sugar this morning was 25, pt ate a cookie and drank apple juice. Pt states his body is cramping all over. Alert and oriented x4

## 2024-08-21 NOTE — ED Provider Notes (Signed)
 [facility] Provider Note    Event Date/Time   First MD Initiated Contact with Patient 08/21/24 1119     (approximate)   History   Hypoglycemia and Diarrhea   HPI  Gordon Garcia is a 69 y.o. male with history of GERD, hypertension, atrial fibrillation on Eliquis , presenting with low blood glucose.  States that he has been having diarrhea for about a month, has also been on the keto diet.  Had some chills this morning.  Blood glucose was 25.  Ate a cookie and drank some apple juice.  Denies history of diabetes.  He has no recent travel, no sick contacts, no nausea vomiting or abdominal pain, no fever, he denies urinary symptoms.  Does note some leg cramping at this time.  No unilateral calf swelling or tenderness, no chest pain or shortness of breath.  No recent hospitalizations or antibiotic use.  States that his diarrhea is watery, no hematochezia or melena.  On independent chart review, he was seen by primary care in October, no history of diabetes, has been stable with regards to his hypertension, GERD.     Physical Exam   Triage Vital Signs: ED Triage Vitals  Encounter Vitals Group     BP 08/21/24 0954 (!) 151/92     Girls Systolic BP Percentile --      Girls Diastolic BP Percentile --      Boys Systolic BP Percentile --      Boys Diastolic BP Percentile --      Pulse Rate 08/21/24 0954 86     Resp 08/21/24 0954 18     Temp 08/21/24 0954 98.6 F (37 C)     Temp src --      SpO2 08/21/24 0954 99 %     Weight 08/21/24 0952 177 lb (80.3 kg)     Height 08/21/24 0952 5' 9 (1.753 m)     Head Circumference --      Peak Flow --      Pain Score --      Pain Loc --      Pain Education --      Exclude from Growth Chart --     Most recent vital signs: Vitals:   08/21/24 0954  BP: (!) 151/92  Pulse: 86  Resp: 18  Temp: 98.6 F (37 C)  SpO2: 99%     General: Awake, no distress.  CV:  Good peripheral perfusion.  Resp:  Normal effort.  No  tachypnea or respiratory distress Abd:  No distention.  Soft nontender Other:  No leg swelling, ambulatory.   ED Results / Procedures / Treatments   Labs (all labs ordered are listed, but only abnormal results are displayed) Labs Reviewed  COMPREHENSIVE METABOLIC PANEL WITH GFR - Abnormal; Notable for the following components:      Result Value   Glucose, Bld 139 (*)    All other components within normal limits  URINALYSIS, ROUTINE W REFLEX MICROSCOPIC - Abnormal; Notable for the following components:   Color, Urine COLORLESS (*)    APPearance CLEAR (*)    Specific Gravity, Urine 1.003 (*)    All other components within normal limits  CBG MONITORING, ED - Abnormal; Notable for the following components:   Glucose-Capillary 142 (*)    All other components within normal limits  LIPASE, BLOOD  CBC  MAGNESIUM  PHOSPHORUS  CK  CBG MONITORING, ED     PROCEDURES:  Critical Care performed: No  Procedures   MEDICATIONS ORDERED IN ED: Medications  acetaminophen  (TYLENOL ) tablet 1,000 mg (1,000 mg Oral Not Given 08/21/24 1231)     IMPRESSION / MDM / ASSESSMENT AND PLAN / ED COURSE  I reviewed the triage vital signs and the nursing notes.                              Differential diagnosis includes, but is not limited to, electrolyte derangements, viral illness, gastroenteritis, no abdominal pain on exam to suggest colitis, diverticulitis, nausea vomiting or abdominal pain to suggest SBO.  For the leg cramping, did consider electrolyte derangements, dehydration, rhabdomyolysis.  Labs.  Will give him some p.o. fluids here.  Patient's presentation is most consistent with acute presentation with potential threat to life or bodily function.  Independent interpretation of labs below.  On reassessment patient is feeling lot better, has not been hypoglycemic in the emergency department.  Did discuss with him about hydration with Gatorade, Pedialyte, soups.  Given that he has been  having the diarrhea for about a month, will also give a number call for GI.  Otherwise considered but no indication for inpatient admission at this time, he safe for outpatient management.  Will discharge with strict return precautions.  Shared decision making done with patient and family and they are agreeable with this plan.    Clinical Course as of 08/21/24 1236  Fri Aug 21, 2024  1121 Independent review of labs, UA is not consistent with UTI, no leukocytosis, lipase is normal, electrolytes not severely deranged, LFTs are normal, he is not hypoglycemic here. [TT]  1208 Phosphorus and mag are within normal limits, CK is not elevated. [TT]  1234 Repeat BG 104. [TT]    Clinical Course User Index [TT] Waymond Lorelle Cummins, MD     FINAL CLINICAL IMPRESSION(S) / ED DIAGNOSES   Final diagnoses:  Hypoglycemia  Diarrhea, unspecified type     Rx / DC Orders   ED Discharge Orders     None        Note:  This document was prepared using Dragon voice recognition software and may include unintentional dictation errors.     Waymond Lorelle Cummins, MD 08/21/24 [phone]

## 2024-08-21 NOTE — Discharge Instructions (Signed)
 Please make sure to keep yourself hydrated, you can use Gatorade, Pedialyte, soups.  I put in number for you to call to the gastroenterologist to follow-up for your diarrhea.  Please make sure to see your primary care doctor next week to get reassessed.

## 2025-07-14 ENCOUNTER — Ambulatory Visit: Admitting: Physician Assistant
